# Patient Record
Sex: Female | Born: 1960 | Race: Black or African American | Hispanic: No | Marital: Married | Smoking: Never smoker
Health system: Southern US, Community
[De-identification: ages and names within clinical notes are randomized; demographics above are authoritative.]

## PROBLEM LIST (undated history)

## (undated) DIAGNOSIS — K219 Gastro-esophageal reflux disease without esophagitis: Secondary | ICD-10-CM

## (undated) DIAGNOSIS — M5412 Radiculopathy, cervical region: Secondary | ICD-10-CM

## (undated) DIAGNOSIS — D75A Glucose-6-phosphate dehydrogenase (G6PD) deficiency without anemia: Secondary | ICD-10-CM

## (undated) HISTORY — DX: Radiculopathy, cervical region: M54.12

## (undated) HISTORY — DX: Glucose-6-phosphate dehydrogenase (G6PD) deficiency without anemia: D75.A

## (undated) HISTORY — DX: Gastro-esophageal reflux disease without esophagitis: K21.9

---

## 1987-01-18 HISTORY — PX: BREAST BIOPSY: SHX20

## 1996-01-18 HISTORY — PX: REDUCTION MAMMAPLASTY: SUR839

## 2001-01-17 HISTORY — PX: TUBAL LIGATION: SHX77

## 2001-01-17 HISTORY — PX: CHOLECYSTECTOMY: SHX55

## 2003-01-18 HISTORY — PX: COSMETIC SURGERY: SHX468

## 2010-05-27 ENCOUNTER — Ambulatory Visit: Payer: Self-pay | Admitting: Obstetrics and Gynecology

## 2010-08-12 ENCOUNTER — Ambulatory Visit: Payer: Self-pay | Admitting: Family Medicine

## 2011-01-18 HISTORY — PX: COLONOSCOPY: SHX174

## 2011-09-20 ENCOUNTER — Ambulatory Visit: Payer: Self-pay | Admitting: Unknown Physician Specialty

## 2011-09-23 LAB — PATHOLOGY REPORT

## 2011-11-14 ENCOUNTER — Encounter: Payer: Self-pay | Admitting: Gastroenterology

## 2011-12-07 ENCOUNTER — Ambulatory Visit: Payer: Self-pay | Admitting: Gastroenterology

## 2015-02-24 ENCOUNTER — Encounter: Payer: Self-pay | Admitting: *Deleted

## 2015-03-04 ENCOUNTER — Ambulatory Visit (INDEPENDENT_AMBULATORY_CARE_PROVIDER_SITE_OTHER): Payer: 59 | Admitting: General Surgery

## 2015-03-04 ENCOUNTER — Encounter: Payer: Self-pay | Admitting: General Surgery

## 2015-03-04 VITALS — BP 104/66 | HR 72 | Resp 14 | Ht 64.0 in | Wt 203.0 lb

## 2015-03-04 DIAGNOSIS — R221 Localized swelling, mass and lump, neck: Secondary | ICD-10-CM

## 2015-03-04 NOTE — Progress Notes (Signed)
Patient ID: Kathryn Mcclain, female   DOB: 27-Nov-1960, 55 y.o.   MRN: [id number]  Chief Complaint  Patient presents with  . Mass    neck    HPI Kathryn Mcclain is a 55 y.o. female.  Here today for evaluation of right neck node. She states this area was found while getting a facial about 4 weeks ago. She does not feel anything in the area and is not having any pain. Denies trouble chewing or swallowing. She does admit to upper respiratory cold symptoms about 3 weeks ago. I have reviewed the history of present illness with the patient. HPI  Past Medical History  Diagnosis Date  . GERD (gastroesophageal reflux disease)   . G6PD deficiency [facility])     Past Surgical History  Procedure Laterality Date  . Cesarean section  1985  . Breast reduction surgery  1989  . Cholecystectomy  2003    Family History  Problem Relation Age of Onset  . Kidney failure Father   . Breast cancer Maternal Aunt   . Breast cancer Maternal Aunt   . Breast cancer Maternal Aunt   . Breast cancer Maternal Aunt     Social History Social History  Substance Use Topics  . Smoking status: Never Smoker   . Smokeless tobacco: Never Used  . Alcohol Use: No    No Known Allergies  Current Outpatient Prescriptions  Medication Sig Dispense Refill  . Calcium Carb-Cholecalciferol (CALCIUM 1000 + D PO) Take by mouth.    Marland Kitchen VITAMIN D, CHOLECALCIFEROL, PO Take 5,000 mg by mouth.     No current facility-administered medications for this visit.    Review of Systems Review of Systems  Constitutional: Negative.   Respiratory: Negative.   Cardiovascular: Negative.     Blood pressure 104/66, pulse 72, resp. rate 14, height  (1.626 m), weight 203 lb (92.08 kg), last menstrual period 01/17/2013.  Physical Exam Physical Exam  Constitutional: She is oriented to person, place, and time. She appears well-developed and well-nourished.  Neck: Neck supple.  Lymphadenopathy:    She has no cervical adenopathy.     She has no axillary adenopathy.  Neurological: She is alert and oriented to person, place, and time.  Skin: Skin is warm and dry.  Psychiatric: Her behavior is normal.  Unable to feel any mass or nodes in right side of neck  Data Reviewed none  Assessment    Stable physical exam, no node noted.     Plan    Follow up as needed or if any mass/node detected by pt or her pcp. The patient is aware to call back for any questions or concerns.        PCP:  None Ref GUTIERREZ, COLLEEN L  This information has been scribed by Dorathy Daft RNBC.   Maurie Olesen G 03/04/2015, 12:28 PM

## 2015-03-04 NOTE — Patient Instructions (Addendum)
Follow up as needed or if symptoms return The patient is aware to call back for any questions or concerns. 

## 2015-03-10 DIAGNOSIS — M7752 Other enthesopathy of left foot: Secondary | ICD-10-CM | POA: Diagnosis not present

## 2015-03-10 DIAGNOSIS — L851 Acquired keratosis [keratoderma] palmaris et plantaris: Secondary | ICD-10-CM | POA: Diagnosis not present

## 2015-04-20 DIAGNOSIS — L709 Acne, unspecified: Secondary | ICD-10-CM | POA: Diagnosis not present

## 2015-06-29 DIAGNOSIS — H524 Presbyopia: Secondary | ICD-10-CM | POA: Diagnosis not present

## 2015-06-29 DIAGNOSIS — H5203 Hypermetropia, bilateral: Secondary | ICD-10-CM | POA: Diagnosis not present

## 2015-06-29 DIAGNOSIS — H52223 Regular astigmatism, bilateral: Secondary | ICD-10-CM | POA: Diagnosis not present

## 2015-08-06 ENCOUNTER — Ambulatory Visit
Admission: RE | Admit: 2015-08-06 | Discharge: 2015-08-06 | Disposition: A | Discharge status: Home / Self Care | Payer: 59 | Source: Ambulatory Visit | Attending: Physician Assistant | Admitting: Physician Assistant

## 2015-08-06 ENCOUNTER — Encounter: Payer: Self-pay | Admitting: Physician Assistant

## 2015-08-06 ENCOUNTER — Ambulatory Visit: Payer: Self-pay | Admitting: Physician Assistant

## 2015-08-06 VITALS — BP 124/80 | HR 60 | Temp 98.3°F

## 2015-08-06 DIAGNOSIS — R05 Cough: Secondary | ICD-10-CM | POA: Insufficient documentation

## 2015-08-06 DIAGNOSIS — I517 Cardiomegaly: Secondary | ICD-10-CM | POA: Insufficient documentation

## 2015-08-06 DIAGNOSIS — R059 Cough, unspecified: Secondary | ICD-10-CM

## 2015-08-06 DIAGNOSIS — R918 Other nonspecific abnormal finding of lung field: Secondary | ICD-10-CM | POA: Diagnosis not present

## 2015-08-06 MED ORDER — BENZONATATE 200 MG PO CAPS: 200.0000 mg | ORAL_CAPSULE | Freq: Two times a day (BID) | ORAL | Status: DC | PRN

## 2015-08-06 NOTE — Progress Notes (Signed)
   Subjective:    Patient ID: Kathryn Mcclain, female    DOB: 1960/05/23, 55 y.o.   MRN: [id number]  HPI Patient states 4-5 months of non-productive cough. Patient states refractory to OTC medications. Patient is non-smoker. Cough is now interfering with her job. She is unable to have conversation without coughing. Denies seasonal allergies. No fever.   Review of Systems Negative except for compliant.    Objective:   Physical Exam HEENT unremarkable. Neck supple without adenopathy. Lungs CTA and Heart RRR.       Assessment & Plan:cough  Trial of Tessalon. Schedule chest x-ray.  Follow up tomorrow.

## 2015-08-24 DIAGNOSIS — J040 Acute laryngitis: Secondary | ICD-10-CM | POA: Diagnosis not present

## 2015-08-24 DIAGNOSIS — R05 Cough: Secondary | ICD-10-CM | POA: Diagnosis not present

## 2015-08-24 DIAGNOSIS — E049 Nontoxic goiter, unspecified: Secondary | ICD-10-CM | POA: Diagnosis not present

## 2015-08-25 ENCOUNTER — Other Ambulatory Visit: Payer: Self-pay | Admitting: Otolaryngology

## 2015-08-25 DIAGNOSIS — E041 Nontoxic single thyroid nodule: Secondary | ICD-10-CM

## 2015-08-25 DIAGNOSIS — E049 Nontoxic goiter, unspecified: Secondary | ICD-10-CM

## 2015-08-28 ENCOUNTER — Ambulatory Visit
Admission: RE | Admit: 2015-08-28 | Discharge: 2015-08-28 | Disposition: A | Discharge status: Home / Self Care | Payer: 59 | Source: Ambulatory Visit | Attending: Otolaryngology | Admitting: Otolaryngology

## 2015-08-28 ENCOUNTER — Other Ambulatory Visit: Payer: Self-pay

## 2015-08-28 DIAGNOSIS — E042 Nontoxic multinodular goiter: Secondary | ICD-10-CM | POA: Diagnosis not present

## 2015-08-28 DIAGNOSIS — E049 Nontoxic goiter, unspecified: Secondary | ICD-10-CM

## 2015-09-11 DIAGNOSIS — E049 Nontoxic goiter, unspecified: Secondary | ICD-10-CM | POA: Diagnosis not present

## 2016-03-07 DIAGNOSIS — H524 Presbyopia: Secondary | ICD-10-CM | POA: Diagnosis not present

## 2016-03-12 ENCOUNTER — Emergency Department (HOSPITAL_COMMUNITY): Payer: 59

## 2016-03-12 ENCOUNTER — Emergency Department (HOSPITAL_COMMUNITY)
Admission: EM | Admit: 2016-03-12 | Discharge: 2016-03-12 | Disposition: A | Discharge status: Home / Self Care | Payer: 59 | Attending: Emergency Medicine | Admitting: Emergency Medicine

## 2016-03-12 ENCOUNTER — Encounter (HOSPITAL_COMMUNITY): Payer: Self-pay | Admitting: *Deleted

## 2016-03-12 DIAGNOSIS — Y939 Activity, unspecified: Secondary | ICD-10-CM | POA: Insufficient documentation

## 2016-03-12 DIAGNOSIS — S161XXA Strain of muscle, fascia and tendon at neck level, initial encounter: Secondary | ICD-10-CM

## 2016-03-12 DIAGNOSIS — Y999 Unspecified external cause status: Secondary | ICD-10-CM | POA: Diagnosis not present

## 2016-03-12 DIAGNOSIS — R079 Chest pain, unspecified: Secondary | ICD-10-CM | POA: Diagnosis not present

## 2016-03-12 DIAGNOSIS — X58XXXA Exposure to other specified factors, initial encounter: Secondary | ICD-10-CM | POA: Insufficient documentation

## 2016-03-12 DIAGNOSIS — S4992XA Unspecified injury of left shoulder and upper arm, initial encounter: Secondary | ICD-10-CM | POA: Diagnosis present

## 2016-03-12 DIAGNOSIS — S46812A Strain of other muscles, fascia and tendons at shoulder and upper arm level, left arm, initial encounter: Secondary | ICD-10-CM | POA: Diagnosis not present

## 2016-03-12 DIAGNOSIS — Z79899 Other long term (current) drug therapy: Secondary | ICD-10-CM | POA: Diagnosis not present

## 2016-03-12 DIAGNOSIS — S46912A Strain of unspecified muscle, fascia and tendon at shoulder and upper arm level, left arm, initial encounter: Secondary | ICD-10-CM | POA: Diagnosis not present

## 2016-03-12 DIAGNOSIS — Y929 Unspecified place or not applicable: Secondary | ICD-10-CM | POA: Insufficient documentation

## 2016-03-12 LAB — BASIC METABOLIC PANEL
Anion gap: 8 (ref 5–15)
BUN: 9 mg/dL (ref 6–20)
CHLORIDE: 109 mmol/L (ref 101–111)
CO2: 25 mmol/L (ref 22–32)
CREATININE: 0.8 mg/dL (ref 0.44–1.00)
Calcium: 9 mg/dL (ref 8.9–10.3)
GFR calc Af Amer: >60 mL/min (ref >60)
GFR calc non Af Amer: >60 mL/min (ref >60)
GLUCOSE: 92 mg/dL (ref 65–99)
POTASSIUM: 4.3 mmol/L (ref 3.5–5.1)
SODIUM: 142 mmol/L (ref 135–145)

## 2016-03-12 LAB — CBC
HEMATOCRIT: 36.5 % (ref 36.0–46.0)
Hemoglobin: 11.6 g/dL — ABNORMAL LOW (ref 12.0–15.0)
MCH: 30.2 pg (ref 26.0–34.0)
MCHC: 31.8 g/dL (ref 30.0–36.0)
MCV: 95.1 fL (ref 78.0–100.0)
PLATELETS: 190 10*3/uL (ref 150–400)
RBC: 3.84 MIL/uL — ABNORMAL LOW (ref 3.87–5.11)
RDW: 13 % (ref 11.5–15.5)
WBC: 5.2 10*3/uL (ref 4.0–10.5)

## 2016-03-12 LAB — I-STAT TROPONIN, ED: Troponin i, poc: 0 ng/mL (ref 0.00–0.08)

## 2016-03-12 MED ORDER — IBUPROFEN 800 MG PO TABS
800.0000 mg | ORAL_TABLET | Freq: Once | ORAL | Status: AC
  Administered 2016-03-12: 800 mg via ORAL
  Filled 2016-03-12: qty 1

## 2016-03-12 MED ORDER — IBUPROFEN 800 MG PO TABS: 800.0000 mg | ORAL_TABLET | Freq: Four times a day (QID) | ORAL | 0 refills | Status: AC | PRN

## 2016-03-12 MED ORDER — ASPIRIN 81 MG PO CHEW
324.0000 mg | CHEWABLE_TABLET | Freq: Once | ORAL | Status: AC
  Administered 2016-03-12: 324 mg via ORAL
  Filled 2016-03-12: qty 4

## 2016-03-12 NOTE — ED Provider Notes (Signed)
[facility]-EMERGENCY DEPT Provider Note   CSN: [id number] Arrival date & time: 03/12/16  [id number]     History   Chief Complaint Chief Complaint  Patient presents with  . Shoulder Pain    left    HPI Kathryn Mcclain is a 56 y.o. female.  The history is provided by the patient.  Shoulder Pain   This is a new problem. The current episode started 1 to 2 hours ago. The problem occurs constantly. Progression since onset: waxingf and waning. The pain is present in the neck and left shoulder. Pertinent negatives include no numbness and full range of motion. She has tried nothing for the symptoms. There has been no history of extremity trauma.    Past Medical History:  Diagnosis Date  . G6PD deficiency (HCC)   . GERD (gastroesophageal reflux disease)     There are no active problems to display for this patient.   Past Surgical History:  Procedure Laterality Date  . BREAST REDUCTION SURGERY  1989  . CESAREAN SECTION  1985  . CHOLECYSTECTOMY  2003    OB History    Gravida Para Term Preterm AB Living   1 1           SAB TAB Ectopic Multiple Live Births                  Obstetric Comments   1st Menstrual Cycle:  15  1st Pregnancy:  22        Home Medications    Prior to Admission medications   Medication Sig Start Date End Date Taking? Authorizing Provider  acetaminophen (TYLENOL) 500 MG tablet Take 1,000 mg by mouth every 6 (six) hours as needed for moderate pain or headache.   Yes Historical Provider, MD  Polyethyl Glycol-Propyl Glycol (SYSTANE OP) Place 2 drops into both eyes as needed (for dry eyes).   Yes Historical Provider, MD  benzonatate (TESSALON) 200 MG capsule Take 1 capsule (200 mg total) by mouth 2 (two) times daily as needed for cough. Patient not taking: Reported on 03/12/2016 08/06/15   Joni Reiningonald K Smith, PA-C    Family History Family History  Problem Relation Age of Onset  . Kidney failure Father   . Breast cancer Maternal Aunt   . Breast cancer Maternal  Aunt   . Breast cancer Maternal Aunt   . Breast cancer Maternal Aunt     Social History Social History  Substance Use Topics  . Smoking status: Never Smoker  . Smokeless tobacco: Never Used  . Alcohol use No     Allergies   Patient has no known allergies.   Review of Systems Review of Systems  Constitutional: Positive for diaphoresis.  Respiratory: Negative for chest tightness and shortness of breath.   Cardiovascular: Negative for chest pain.  Gastrointestinal: Negative for nausea and vomiting.  Neurological: Negative for numbness.  All other systems reviewed and are negative.    Physical Exam Updated Vital Signs BP 115/94   Pulse (!) 54   Resp 23   Ht 5\' 4"  (1.626 m)   Wt 205 lb (93 kg)   SpO2 97%   BMI 35.19 kg/m   Physical Exam  Constitutional: She is oriented to person, place, and time. She appears well-developed and well-nourished. No distress.  HENT:  Head: Normocephalic.  Nose: Nose normal.  Eyes: Conjunctivae are normal.  Neck: Neck supple. No tracheal deviation present.  Cardiovascular: Normal rate and regular rhythm.   Pulmonary/Chest: Effort normal. No respiratory distress.  She exhibits no tenderness.  Abdominal: Soft. She exhibits no distension.  Musculoskeletal:       Left shoulder: She exhibits tenderness (at mid-trapezius muscle superiorly and medial to left scapula). She exhibits normal range of motion, no bony tenderness, no swelling, no deformity and no spasm.       Arms: Neurological: She is alert and oriented to person, place, and time.  Skin: Skin is warm and dry.  Psychiatric: She has a normal mood and affect.  Vitals reviewed.    ED Treatments / Results  Labs (all labs ordered are listed, but only abnormal results are displayed) Labs Reviewed  CBC - Abnormal; Notable for the following:       Result Value   RBC 3.84 (*)    Hemoglobin 11.6 (*)    All other components within normal limits  BASIC METABOLIC PANEL  I-STAT  TROPOININ, ED    EKG  EKG Interpretation  Date/Time:  Saturday March 12 2016 08:42:23 EST Ventricular Rate:  56 PR Interval:    QRS Duration: 77 QT Interval:  407 QTC Calculation: 393 R Axis:   -26 Text Interpretation:  Sinus rhythm Borderline left axis deviation Otherwise normal ECG No previous tracing Confirmed by Jouri Threat MD, Reuel Boom ([id number]) on 03/12/2016 9:14:47 AM       Radiology Dg Chest 2 View  Result Date: 03/12/2016 CLINICAL DATA:  Chest pain radiating to the left side of the neck and shoulder beginning this morning. EXAM: CHEST  2 VIEW COMPARISON:  PA and lateral chest 08/06/2015. FINDINGS: The lungs are clear. Heart size is normal. No pneumothorax or pleural fluid. No acute bony abnormality. Cholecystectomy clips noted. IMPRESSION: No acute disease. Electronically Signed   By: Drusilla Kanner M.D.   On: 03/12/2016 09:28    Procedures Procedures (including critical care time)  Medications Ordered in ED Medications  ibuprofen (ADVIL,MOTRIN) tablet 800 mg (not administered)  aspirin chewable tablet 324 mg (324 mg Oral Given 03/12/16 0905)     Initial Impression / Assessment and Plan / ED Course  I have reviewed the triage vital signs and the nursing notes.  Pertinent labs & imaging results that were available during my care of the patient were reviewed by me and considered in my medical decision making (see chart for details).     56 year old female presents with left shoulder pain that she first noted 2 hours prior to arrival to emergency department after waking. She states that the pain started in her left neck and has been aching and radiating into her left posterior shoulder and left arm. She has no ACS symptoms. Negative chest x-ray, troponin and normal EKG are reassuring with low overall risk given her atypical symptoms.  Point tenderness is noted over the left superior shoulder and posterior shoulder in the musculature suggesting MSK etiology. Offered local  anesthesia injection which patient declined. Patient was recommended to take short course of scheduled NSAIDs and engage in early mobility as definitive treatment. Plan to follow up with PCP as needed and return precautions discussed for worsening or new concerning symptoms.   Final Clinical Impressions(s) / ED Diagnoses   Final diagnoses:  Strain of cervical portion of left trapezius muscle    New Prescriptions New Prescriptions   No medications on file     Lyndal Pulley, MD 03/12/16 [phone]

## 2016-03-12 NOTE — ED Triage Notes (Signed)
Pt reports she awoke approx 0700 due to left upper chest and shoulder pain - states the pain radiates down from her neck to her left arm. Denies shortness of breath, n/v - admits to some diaphoresis. Pt denies any medical hx - only family hx includes htn and breast CA. Pt A&Ox4, in no acute distress at this time. Pleasant and cooperative.

## 2016-03-12 NOTE — ED Notes (Signed)
Pt ambulating independently w/ steady gait on d/c in no acute distress, A&Ox4. D/c instructions reviewed w/ pt and family - pt and family deny any further questions or concerns at present. Rx given x1  

## 2016-03-14 ENCOUNTER — Ambulatory Visit (INDEPENDENT_AMBULATORY_CARE_PROVIDER_SITE_OTHER): Payer: 59 | Admitting: Orthopaedic Surgery

## 2016-03-14 ENCOUNTER — Encounter (INDEPENDENT_AMBULATORY_CARE_PROVIDER_SITE_OTHER): Payer: Self-pay | Admitting: Orthopaedic Surgery

## 2016-03-14 DIAGNOSIS — M5412 Radiculopathy, cervical region: Secondary | ICD-10-CM

## 2016-03-14 MED ORDER — PREDNISONE 10 MG (21) PO TBPK: ORAL_TABLET | ORAL | 0 refills | Status: DC

## 2016-03-14 MED ORDER — DICLOFENAC SODIUM 75 MG PO TBEC: 75.0000 mg | DELAYED_RELEASE_TABLET | Freq: Two times a day (BID) | ORAL | 2 refills | Status: DC

## 2016-03-14 MED ORDER — METHOCARBAMOL 500 MG PO TABS
500.0000 mg | ORAL_TABLET | Freq: Four times a day (QID) | ORAL | 2 refills | Status: DC | PRN
Start: 2016-03-14 — End: 2016-08-05

## 2016-03-14 NOTE — Progress Notes (Signed)
Office Visit Note   Patient: Kathryn Mcclain           Date of Birth: 07-13-1960           MRN: [id number] Visit Date: 03/14/2016              Requested by: No referring provider defined for this encounter. PCP: No PCP Per Patient   Assessment & Plan: Visit Diagnoses:  1. Cervical radiculopathy     Plan: Patient likely has cervical radiculopathy. I recommend 2 weeks out of work. Also recommended physical therapy, diclofenac, Robaxin, Sterapred. I will see her back as needed. Questions encouraged and answered.  Follow-Up Instructions: Return if symptoms worsen or fail to improve.   Orders:  Orders Placed This Encounter  Procedures  . Ambulatory referral to Physical Therapy   Meds ordered this encounter  Medications  . diclofenac (VOLTAREN) 75 MG EC tablet    Sig: Take 1 tablet (75 mg total) by mouth 2 (two) times daily.    Dispense:  30 tablet    Refill:  2  . methocarbamol (ROBAXIN) 500 MG tablet    Sig: Take 1 tablet (500 mg total) by mouth every 6 (six) hours as needed for muscle spasms.    Dispense:  30 tablet    Refill:  2  . predniSONE (STERAPRED UNI-PAK 21 TAB) 10 MG (21) TBPK tablet    Sig: Take as directed    Dispense:  21 tablet    Refill:  0      Procedures: No procedures performed   Clinical Data: No additional findings.   Subjective: Chief Complaint  Patient presents with  . Left Shoulder - Pain  . Neck - Pain    Patient [age] year old female who woke up this past weekend with neck and shoulder and arm pain on the left side. She denies any injuries. She was evaluated in the ER cardiac workup was negative. Was in today for follow-up from the ER. Her pain is 5 out of 10 and radiates from the neck all the way down to the forearm. She denies any focal weakness or numbness.    Review of Systems Complete review of systems negative except for history of present illness  Objective: Vital Signs: There were no vitals taken for this  visit.  Physical Exam  Constitutional: She is oriented to person, place, and time. She appears well-developed and well-nourished.  HENT:  Head: Normocephalic and atraumatic.  Eyes: EOM are normal.  Neck: Neck supple.  Pulmonary/Chest: Effort normal.  Abdominal: Soft.  Neurological: She is alert and oriented to person, place, and time.  Skin: Skin is warm. Capillary refill takes less than 2 seconds.  Psychiatric: She has a normal mood and affect. Her behavior is normal. Judgment and thought content normal.  Nursing note and vitals reviewed.   Ortho Exam Exam the left upper extremity is normal for motor sensory and reflexes. She has a positive shoulder abduction sign. Specialty Comments:  No specialty comments available.  Imaging: No results found.   PMFS History: Patient Active Problem List   Diagnosis Date Noted  . Cervical radiculopathy 03/14/2016   Past Medical History:  Diagnosis Date  . G6PD deficiency (HCC)   . GERD (gastroesophageal reflux disease)     Family History  Problem Relation Age of Onset  . Kidney failure Father   . Breast cancer Maternal Aunt   . Breast cancer Maternal Aunt   . Breast cancer Maternal Aunt   . Breast cancer  Maternal Aunt     Past Surgical History:  Procedure Laterality Date  . BREAST REDUCTION SURGERY  1989  . CESAREAN SECTION  1985  . CHOLECYSTECTOMY  2003   Social History   Occupational History  . Not on file.   Social History Main Topics  . Smoking status: Never Smoker  . Smokeless tobacco: Never Used  . Alcohol use No  . Drug use: No  . Sexual activity: Not on file

## 2016-03-15 ENCOUNTER — Encounter (INDEPENDENT_AMBULATORY_CARE_PROVIDER_SITE_OTHER): Payer: Self-pay | Admitting: Orthopaedic Surgery

## 2016-03-15 NOTE — Telephone Encounter (Signed)
Patient calling wanting to know if you had received her message to you thru MY CHART re Percocet.  [place] and is able to pick up Rx when called, but is hoping to hear from us before the office closes at 5pm.

## 2016-03-15 NOTE — Telephone Encounter (Signed)
Patient sent an e-mail. Please advise. See message below. Thanks.     GM Dr. Roda ShuttersXu,    Medication request: Will you write me a prescription for Percocet? The pain is not getting much better with the NSAID. I had a c-section many years ago and percocet and motrin really help. My son is 55 years old - I remember the pain and the medication I took to alleviate the pain was percocet and motrin.     Thank You.

## 2016-03-15 NOTE — Telephone Encounter (Signed)
Yes #10 of percocet.

## 2016-03-16 ENCOUNTER — Other Ambulatory Visit (INDEPENDENT_AMBULATORY_CARE_PROVIDER_SITE_OTHER): Payer: Self-pay

## 2016-03-16 MED ORDER — OXYCODONE-ACETAMINOPHEN 5-325 MG PO TABS: 1.0000 | ORAL_TABLET | Freq: Every day | ORAL | 0 refills | Status: DC

## 2016-03-17 ENCOUNTER — Ambulatory Visit: Payer: 59 | Attending: Orthopaedic Surgery | Admitting: Physical Therapy

## 2016-03-17 DIAGNOSIS — M542 Cervicalgia: Secondary | ICD-10-CM | POA: Diagnosis not present

## 2016-03-17 DIAGNOSIS — M6281 Muscle weakness (generalized): Secondary | ICD-10-CM | POA: Diagnosis not present

## 2016-03-17 DIAGNOSIS — M5412 Radiculopathy, cervical region: Secondary | ICD-10-CM | POA: Insufficient documentation

## 2016-03-17 DIAGNOSIS — M62830 Muscle spasm of back: Secondary | ICD-10-CM | POA: Insufficient documentation

## 2016-03-17 DIAGNOSIS — R293 Abnormal posture: Secondary | ICD-10-CM | POA: Diagnosis not present

## 2016-03-17 NOTE — Patient Instructions (Signed)
    Trigger Point Dry Needling  . What is Trigger Point Dry Needling (DN)? o DN is a physical therapy technique used to treat muscle pain and dysfunction. Specifically, DN helps deactivate muscle trigger points (muscle knots).  o A thin filiform needle is used to penetrate the skin and stimulate the underlying trigger point. The goal is for a local twitch response (LTR) to occur and for the trigger point to relax. No medication of any kind is injected during the procedure.   . What Does Trigger Point Dry Needling Feel Like?  o The procedure feels different for each individual patient. Some patients report that they do not actually feel the needle enter the skin and overall the process is not painful. Very mild bleeding may occur. However, many patients feel a deep cramping in the muscle in which the needle was inserted. This is the local twitch response.   Marland Kitchen. How Will I feel after the treatment? o Soreness is normal, and the onset of soreness may not occur for a few hours. Typically this soreness does not last longer than two days.  o Bruising is uncommon, however; ice can be used to decrease any possible bruising.  o In rare cases feeling tired or nauseous after the treatment is normal. In addition, your symptoms may get worse before they get better, this period will typically not last longer than 24 hours.   . What Can I do After My Treatment? o Increase your hydration by drinking more water for the next 24 hours. o You may place ice or heat on the areas treated that have become sore, however, do not use heat on inflamed or bruised areas. Heat often brings more relief post needling. o You can continue your regular activities, but vigorous activity is not recommended initially after the treatment for 24 hours. o DN is best combined with other physical therapy such as strengthening, stretching, and other therapies.   Garen LahLawrie Beardsley, PT Certified Exercise Expert for the Aging Adult  03/17/16  10:38 AM Phone: [phone] Fax: [phone]

## 2016-03-17 NOTE — Therapy (Signed)
[facility] [address] [place], [place], [address] Phone: [phone]   Fax:  [phone]  Physical Therapy Evaluation  Patient Details  Name: Kathryn Mcclain MRN: [id number] Date of Birth: 06/27/1960 Referring Provider: Tarry KosXU, Naiping M MD  Encounter Date: 03/17/2016      PT End of Session - 03/17/16 1018    Visit Number 1   Number of Visits 12   Date for PT Re-Evaluation 04/28/16   Authorization Type UMR / TRIC[employer]   Authorization Time Period 04-28-16   PT Start Time 1015   PT Stop Time 1059   PT Time Calculation (min) 44 min   Activity Tolerance Patient tolerated treatment well   Behavior During Therapy [facility] for tasks assessed/performed      Past Medical History:  Diagnosis Date  . G6PD deficiency (HCC)   . GERD (gastroesophageal reflux disease)     Past Surgical History:  Procedure Laterality Date  . BREAST REDUCTION SURGERY  1989  . CESAREAN SECTION  1985  . CHOLECYSTECTOMY  2003    There were no vitals filed for this visit.       Subjective Assessment - 03/17/16 1019    Subjective Pt states she woke up on Saturday morning in pain.  in my left neck and to my elbow, Most of pain is in pain between left shoulder and elbow, I have spasms in my neck and back. I work Programmer, systemslabor and deleviery at [employer]   Limitations Lifting;House hold activities;Sitting   How long can you sit comfortably? 15 min   Diagnostic tests x ray   Patient Stated Goals To reduce pain, better movement in left shoulder   Currently in Pain? Yes   Pain Score 4    Pain Location Neck  upper back on left, rib junction   Pain Orientation Left   Pain Descriptors / Indicators Aching;Spasm   Pain Type Acute pain   Pain Onset In the past 7 days   Pain Frequency Constant   Aggravating Factors  sitting long periods of time, do my hair and tie my shoes   Pain Relieving Factors heat and ice , medication, motrin, Robaxin            [facility]  PT Assessment - 03/17/16 1024      Assessment   Medical Diagnosis cervical radiculopathy on left   Referring Provider Tarry KosXU, Naiping M MD   Onset Date/Surgical Date 03/12/16   Hand Dominance Right   Next MD Visit call when needed   Prior Therapy none     Precautions   Precautions None     Restrictions   Weight Bearing Restrictions No     Balance Screen   Has the patient fallen in the past 6 months No   Has the patient had a decrease in activity level because of a fear of falling?  No   Is the patient reluctant to leave their home because of a fear of falling?  No     Home Environment   Living Environment Private residence   Living Arrangements Spouse/significant other   Home Access Stairs to enter   Entrance Stairs-Number of Steps 1   Entrance Stairs-Rails Left   Home Layout Two level     Prior Function   Level of Independence Independent   Vocation Full time employment   Vocation Requirements MAU RN     Cognition   Overall Cognitive Status Within Functional Limits for tasks assessed     Observation/Other  Assessments   Focus on Therapeutic Outcomes (FOTO)  FOTOIntake 57%  limitation 45%  predicted 23%      Posture/Postural Control   Posture/Postural Control Postural limitations   Postural Limitations Rounded Shoulders;Forward head   Posture Comments Pt with significant gaurding and elevated left Upper trap/levator     ROM / Strength   AROM / PROM / Strength AROM;Strength     AROM   Cervical Flexion 53  no pain   Cervical Extension 55  ERP   Cervical - Right Side Bend 30   Cervical - Left Side Bend 25  ERP   Cervical - Right Rotation 75   Cervical - Left Rotation 60     Strength   Overall Strength Deficits   Right Shoulder Flexion 5/5   Right Shoulder ABduction 5/5   Left Shoulder Flexion 4/5  gaurding due to pain   Left Shoulder ABduction 4/5  gaurding due to pain     Palpation   Palpation comment spasm of left Upper trapezius and paraspinals of upper  pack/cervical L>R     Spurling's   Findings Positive   Side Left   Comment Pt with relief when left on top of head                   [facility] Adult PT Treatment/Exercise - 03/17/16 1024      Manual Therapy   Manual Therapy Taping   Manual therapy comments trigger point soft tissue of left upper trap, levator and rhomboid   McConnell upper trap inhibition taper     Neck Exercises: Stretches   Upper Trapezius Stretch 30 seconds;3 reps   Upper Trapezius Stretch Limitations stretch left only   Levator Stretch 30 seconds;3 reps   Levator Stretch Limitations stretch left only   Other Neck Stretches neck retraction 5 x 5 sec hold          Trigger Point Dry Needling - 03/17/16 1042    Consent Given? Yes   Education Handout Provided Yes   Muscles Treated Upper Body Rhomboids;Levator scapulae;Upper trapezius  left side only   Upper Trapezius Response Twitch reponse elicited;Palpable increased muscle length   Levator Scapulae Response Twitch response elicited;Palpable increased muscle length   Rhomboids Response Twitch response elicited;Palpable increased muscle length              PT Education - 03/17/16 1038    Education provided Yes   Education Details POC,, explanation of findings, Trigger point dry needling and neck stretches HEP   Person(s) Educated Patient   Methods Explanation;Demonstration;Verbal cues;Handout   Comprehension Verbalized understanding;Returned demonstration          PT Short Term Goals - 03/17/16 1108      PT SHORT TERM GOAL #1   Title STG=LTG           PT Long Term Goals - 03/17/16 1114      PT LONG TERM GOAL #1   Title "Demonstrate and verbalize techniques to reduce the risk of re-injury including: lifting, posture, body mechanics.    Baseline Target 04-29-14   Time 6   Period Weeks   Status New     PT LONG TERM GOAL #2   Title "FOTO will improve from 43% limtation    to 23% limtation     indicating improved functional  mobility.    Baseline Target 04-28-16   Time 6   Period Weeks   Status New     PT LONG TERM GOAL #3  Title "Pt will tolerate sitting 1 hour without increased pain to ride in car without increased pain.   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  She woke up on 03-12-16 with suddent spasm and radiating pain down left arm.  She presents now with 4/10 pain and radiating into lateral upper arm. Ms. Bergh c/o of increased neck pain and finds it difficult to fix her hair or bend down to tie shoes or do her job as MSN labor and delivery.  Pt presents with signs and symptoms compatible with cervical radiculopathy due to disc derangement. and subsequent spasms.   Pt would benefit from skilled PT for 2 times a week for 6 weeks to address above impariments and functional limitations, severe spasms of upper trapezius and left rhomboid and return to pain-free PLOF. 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 review neck stretches   PT Home Exercise Plan Neck retraction, levator and upper trap stretch   Consulted and Agree with Plan of Care Patient      Patient will benefit from skilled therapeutic intervention in order to improve the following deficits and impairments:  Pain, Impaired UE functional use, Increased muscle spasms, Decreased strength, Postural dysfunction, Improper body mechanics, Decreased range of motion  Visit Diagnosis: Cervicalgia  Radiculopathy, cervical region  Muscle spasm of back  Abnormal posture  Muscle weakness (generalized)      G-Codes - 04-13-2016 1102    Functional Assessment Tool Used (Outpatient Only) FOTO   Functional Limitation Changing and maintaining body position   Changing and Maintaining Body Position Current Status (U0454) At least 40 percent but less than 60 percent impaired, limited or restricted  43%   Changing and Maintaining Body Position Goal Status (U9811) At least 20 percent but less than 40 percent impaired, limited or restricted  23%       Problem List Patient Active Problem List   Diagnosis Date Noted  . Cervical radiculopathy 03/14/2016   Garen Lah, PT Certified Exercise Expert for the Aging Adult  2016/04/13 11:25 AM Phone: [phone] Fax: [phone]  [facility] [address] [place], [place], [address] Phone: [phone]   Fax:  [phone]  Name: Kathryn Mcclain MRN: [id number] Date of Birth: 08-24-1960

## 2016-03-21 ENCOUNTER — Encounter (INDEPENDENT_AMBULATORY_CARE_PROVIDER_SITE_OTHER): Payer: Self-pay | Admitting: Orthopaedic Surgery

## 2016-03-21 ENCOUNTER — Encounter (INDEPENDENT_AMBULATORY_CARE_PROVIDER_SITE_OTHER): Payer: Self-pay

## 2016-03-22 ENCOUNTER — Ambulatory Visit: Payer: 59 | Admitting: Physical Therapy

## 2016-03-22 DIAGNOSIS — M62830 Muscle spasm of back: Secondary | ICD-10-CM

## 2016-03-22 DIAGNOSIS — M5412 Radiculopathy, cervical region: Secondary | ICD-10-CM | POA: Diagnosis not present

## 2016-03-22 DIAGNOSIS — M6281 Muscle weakness (generalized): Secondary | ICD-10-CM | POA: Diagnosis not present

## 2016-03-22 DIAGNOSIS — M542 Cervicalgia: Secondary | ICD-10-CM

## 2016-03-22 DIAGNOSIS — R293 Abnormal posture: Secondary | ICD-10-CM | POA: Diagnosis not present

## 2016-03-22 NOTE — Therapy (Signed)
[facility] [address] [place], [place], [address] Phone: [phone]   Fax:  [phone]  Physical Therapy Treatment  Patient Details  Name: GOWRI SUCHAN MRN: [id number] Date of Birth: 1960-01-30 Referring Provider: Tarry Kos MD  Encounter Date: 03/22/2016      PT End of Session - 03/22/16 0813    Visit Number 2   Number of Visits 12   Date for PT Re-Evaluation 04/28/16   Authorization Type UMR / TRICARE   Authorization Time Period 04-28-16   PT Start Time 0800   PT Stop Time 0855   PT Time Calculation (min) 55 min   Activity Tolerance Patient tolerated treatment well   Behavior During Therapy [facility] for tasks assessed/performed      Past Medical History:  Diagnosis Date  . G6PD deficiency (HCC)   . GERD (gastroesophageal reflux disease)     Past Surgical History:  Procedure Laterality Date  . BREAST REDUCTION SURGERY  1989  . CESAREAN SECTION  1985  . CHOLECYSTECTOMY  2003    There were no vitals filed for this visit.      Subjective Assessment - 03/22/16 0805    Subjective The dry needling and the tape helped.  I am returning to work this coming Sunday.  I was not too sore with the dry needling.    Limitations Lifting;House hold activities;Sitting   How long can you sit comfortably? 30 minutes   Currently in Pain? Yes   Pain Score 4   no medication.     Pain Location Neck   Pain Orientation Left   Pain Descriptors / Indicators Aching;Spasm   Pain Type Acute pain   Pain Onset In the past 7 days   Pain Frequency Constant                         [facility] Adult PT Treatment/Exercise - 03/22/16 0818      Shoulder Exercises: Standing   External Rotation Strengthening;Both;15 reps   Theraband Level (Shoulder External Rotation) Level 2 (Red)   External Rotation Limitations x 2 with VC and TC   Extension Strengthening;Both;15 reps;Theraband   Theraband Level (Shoulder Extension) Level  2 (Red)   Extension Limitations x2 TC and VC    Row Strengthening;Both;15 reps   Theraband Level (Shoulder Row) Level 2 (Red)   Row Limitations x2 VC and TC     Moist Heat Therapy   Number Minutes Moist Heat 15 Minutes   Moist Heat Location Cervical;Shoulder     Manual Therapy   Manual Therapy Soft tissue mobilization;Myofascial release   Manual therapy comments tigger point on left upper trap and levator   Joint Mobilization left lateral UPA C-2 to C-6  PA mobs as well grade 3/4   Soft tissue mobilization bil upper trap and levator with IASTYM   Myofascial Release left upper trap     Neck Exercises: Stretches   Upper Trapezius Stretch 3 reps;30 seconds   Upper Trapezius Stretch Limitations left only   Levator Stretch 3 reps;30 seconds   Levator Stretch Limitations left only          Trigger Point Dry Needling - 03/22/16 0847    Consent Given? Yes   Education Handout Provided No  previously given   Muscles Treated Upper Body Rhomboids;Levator scapulae;Upper trapezius  left C-3 to C-6 erector spinae   Upper Trapezius Response Twitch reponse elicited;Palpable increased muscle length  bil   Levator Scapulae  Response Twitch response elicited;Palpable increased muscle length  bil   Rhomboids Response Twitch response elicited;Palpable increased muscle length  left only              PT Education - 03/22/16 0810    Education provided Yes   Education Details Added to HEP standing scapular stabilizers, reviewed neck stretching   Person(s) Educated Patient   Methods Explanation;Demonstration;Verbal cues;Handout   Comprehension Verbalized understanding;Returned demonstration          PT Short Term Goals - 03/22/16 0815      PT SHORT TERM GOAL #1   Title STG=LTG           PT Long Term Goals - 03/22/16 0815      PT LONG TERM GOAL #1   Title "Demonstrate and verbalize techniques to reduce the risk of re-injury including: lifting, posture, body mechanics.     Baseline Target 04-29-14   Time 6   Period Weeks   Status On-going     PT LONG TERM GOAL #2   Title "FOTO will improve from 43% limtation    to 23% limtation     indicating improved functional mobility.    Baseline Target 04-28-16   Time 6   Period Weeks   Status On-going     PT LONG TERM GOAL #3   Title "Pt will tolerate sitting 1 hour without increased pain to ride in car without increased pain.   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  She states she is not using medication today and she really likes the TDN for neck.  She is returning to work on Sunday night and is encouraged about progress so far.  Pt edcuated on HEP for strengthening   Rehab Potential Excellent   PT Frequency 2x / week   PT Duration 6 weeks   PT Treatment/Interventions ADLs/Self Care Home Management;Cryotherapy;Electrical Stimulation;Iontophoresis 4mg /ml Dexamethasone;Moist Heat;Ultrasound;Traction;Therapeutic exercise;Neuromuscular re-education;Patient/family education;Passive range of motion;Manual techniques;Dry needling;Taping   PT Next Visit Plan assess DN  posture training and deep neck flexors. review neck stretches/HEP    PT Home Exercise Plan Neck retraction, levator and upper trap stretch, standing scapular ex      Patient will benefit from skilled therapeutic  intervention in order to improve the following deficits and impairments:  Pain, Impaired UE functional use, Increased muscle spasms, Decreased strength, Postural dysfunction, Improper body mechanics, Decreased range of motion  Visit Diagnosis: Cervicalgia  Radiculopathy, cervical region  Muscle spasm of back  Abnormal posture  Muscle weakness (generalized)     Problem List Patient Active Problem List   Diagnosis Date Noted  . Cervical radiculopathy 03/14/2016   Garen Lah, PT  Certified Exercise Expert for the Aging Adult  03/22/16 8:49 AM Phone: [phone] Fax: [phone]  [facility] [address] [place], [place], [address] Phone: [phone]   Fax:  [phone]  Name: Jeanne IvanMargaret A Bagshaw MRN: [id number] Date of Birth: 1960/06/19

## 2016-03-22 NOTE — Patient Instructions (Signed)
   Garen LahLawrie Ward Boissonneault, PT Certified Exercise Expert for the Aging Adult  03/22/16 8:10 AM Phone: [phone] Fax: [phone]

## 2016-03-23 ENCOUNTER — Encounter (INDEPENDENT_AMBULATORY_CARE_PROVIDER_SITE_OTHER): Payer: Self-pay | Admitting: Orthopaedic Surgery

## 2016-03-23 ENCOUNTER — Encounter (INDEPENDENT_AMBULATORY_CARE_PROVIDER_SITE_OTHER): Payer: Self-pay

## 2016-03-24 ENCOUNTER — Ambulatory Visit: Payer: 59 | Admitting: Physical Therapy

## 2016-03-24 DIAGNOSIS — R293 Abnormal posture: Secondary | ICD-10-CM | POA: Diagnosis not present

## 2016-03-24 DIAGNOSIS — M542 Cervicalgia: Secondary | ICD-10-CM

## 2016-03-24 DIAGNOSIS — M6281 Muscle weakness (generalized): Secondary | ICD-10-CM | POA: Diagnosis not present

## 2016-03-24 DIAGNOSIS — M5412 Radiculopathy, cervical region: Secondary | ICD-10-CM | POA: Diagnosis not present

## 2016-03-24 DIAGNOSIS — M62830 Muscle spasm of back: Secondary | ICD-10-CM

## 2016-03-24 NOTE — Therapy (Signed)
[facility] [address] [place], [place], [address] Phone: [phone]   Fax:  [phone]  Physical Therapy Treatment  Patient Details  Name: Kathryn Mcclain MRN: [id number] Date of Birth: Jan 28, 1960 Referring Provider: Tarry KosXU, Naiping M MD  Encounter Date: 03/24/2016      Kathryn End of Session - 03/24/16 1419    Visit Number 3   Number of Visits 12   Date for Kathryn Re-Evaluation 04/28/16   Authorization Type UMR / TRICARE   Authorization Time Period 04-28-16   Kathryn Start Time 0215   Kathryn Stop Time 0310   Kathryn Time Calculation (min) 55 min   Activity Tolerance Patient tolerated treatment well   Behavior During Therapy [facility] for tasks assessed/performed      Past Medical History:  Diagnosis Date  . G6PD deficiency (HCC)   . GERD (gastroesophageal reflux disease)     Past Surgical History:  Procedure Laterality Date  . BREAST REDUCTION SURGERY  1989  . CESAREAN SECTION  1985  . CHOLECYSTECTOMY  2003    There were no vitals filed for this visit.      Subjective Assessment - 03/24/16 1419    Subjective I dont think I need the needling today.  I went to the salon for my hair and I felt gaurded.     Limitations Lifting;House hold activities;Sitting   How long can you sit comfortably? 1 hour   Patient Stated Goals To reduce pain, better movement in left shoulder   Currently in Pain? Yes   Pain Score 3    Pain Location Neck   Pain Orientation Left   Pain Descriptors / Indicators Aching;Spasm   Pain Type Acute pain   Pain Onset 1 to 4 weeks ago   Pain Frequency Constant            [facility] Kathryn Assessment - 03/24/16 1451      AROM   Cervical Flexion 55  no pain   Cervical Extension 60   Cervical - Right Side Bend 50   Cervical - Left Side Bend 43   Cervical - Right Rotation 80   Cervical - Left Rotation 70                     [facility] Mcclain Kathryn Treatment/Exercise - 03/24/16 1423      Neck Exercises: Supine   Neck Retraction 5 reps;5 secs   Other Supine Exercise supine scapular stabilizers x 10 with UE flex, abd diagonals both ways and ER bil 10 each     Moist Heat Therapy   Number Minutes Moist Heat 15 Minutes   Moist Heat Location Cervical;Shoulder     Ultrasound   Ultrasound Location left upper trap   Ultrasound Parameters 1.5 w/cm2  100%   Ultrasound Goals Pain     Manual Therapy   Manual Therapy Soft tissue mobilization;Myofascial release   Manual therapy comments trigger point on left upper trap and levator   Joint Mobilization left lateral UPA C-2 to C-6  PA mobs as well grade 3/4   Soft tissue mobilization bil upper trap and levator with IASTYM   Myofascial Release left upper trap                Kathryn Education - 03/24/16 1428    Education provided Yes   Education Details supine scapular stabiizers with green t band and self snag with towel   Person(s) Educated Patient   Methods Explanation;Demonstration;Tactile cues;Verbal cues;Handout  Comprehension Verbalized understanding;Returned demonstration          Kathryn Short Term Goals - 03/22/16 0815      Kathryn SHORT TERM GOAL #1   Title STG=LTG           Kathryn Long Term Goals - 03/24/16 1458      Kathryn LONG TERM GOAL #1   Title "Demonstrate and verbalize techniques to reduce the risk of re-injury including: lifting, posture, body mechanics.    Baseline Target 04-29-14   Time 6   Period Weeks   Status On-going     Kathryn LONG TERM GOAL #2   Title "FOTO will improve from 43% limtation    to 23% limtation     indicating improved functional mobility.    Baseline Target 04-28-16   Time 6   Period Weeks   Status On-going     Kathryn LONG TERM GOAL #3   Title "Kathryn will tolerate sitting 1 hour without increased pain to ride in car without increased pain.   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  AROM increased in every plane  See flowsheet.  Kathryn declined TDN,  Kathryn given supine scapular stabilizers with green t band to increase exercises and Kathryn pain decreased to 2/10 at end of session.  will continue to progress. Kathryn returns to work on Sunday.   Rehab Potential Excellent   Kathryn Frequency 2x / week   Kathryn Duration 6 weeks   Kathryn Treatment/Interventions ADLs/Self Care Home Management;Cryotherapy;Electrical Stimulation;Iontophoresis 4mg /ml Dexamethasone;Moist Heat;Ultrasound;Traction;Therapeutic exercise;Neuromuscular re-education;Patient/family education;Passive range of motion;Manual techniques;Dry needling;Taping   Kathryn Next Visit Plan  posture training give ADL handout and deep neck flexors. review neck stretches/HEP    Kathryn Home Exercise Plan Neck retraction, levator and upper trap stretch, standing scapular ex supine scap stabilizers   Consulted and Agree with Plan of Care Patient      Patient will benefit from skilled therapeutic intervention in order to improve the following deficits and impairments:  Pain, Impaired UE functional use, Increased muscle spasms, Decreased strength, Postural dysfunction, Improper body mechanics, Decreased range of motion  Visit Diagnosis: Cervicalgia  Radiculopathy, cervical region  Muscle spasm of back  Abnormal posture  Muscle weakness (generalized)     Problem List Patient Active Problem List   Diagnosis Date Noted  . Cervical radiculopathy 03/14/2016   Kathryn Mcclain, Kathryn Mcclain  03/24/16 3:02 PM Phone: [phone] Fax: [phone]  [facility] [address] [place], [place], [address] Phone: [phone]   Fax:  [phone]  Name: Kathryn Mcclain MRN: [id number] Date of Birth: 13-Sep-1960

## 2016-03-24 NOTE — Patient Instructions (Signed)
Over Head Pull: Narrow Grip       On back, knees bent, feet flat, band across thighs, elbows straight but relaxed. Pull hands apart (start). Keeping elbows straight, bring arms up and over head, hands toward floor. Keep pull steady on band. Hold momentarily. Return slowly, keeping pull steady, back to start. Repeat _15__ times. Band color __green____   Side Pull: Double Arm   On back, knees bent, feet flat. Arms perpendicular to body, shoulder level, elbows straight but relaxed. Pull arms out to sides, elbows straight. Resistance band comes across collarbones, hands toward floor. Hold momentarily. Slowly return to starting position. Repeat _15__ times. Band color green_____   Sash   On back, knees bent, feet flat, left hand on left hip, right hand above left. Pull right arm DIAGONALLY (hip to shoulder) across chest. Bring right arm along head toward floor. Hold momentarily. Slowly return to starting position. Repeat 15___ times. Do with left arm. Band color __green____   Shoulder Rotation: Double Arm   On back, knees bent, feet flat, elbows tucked at sides, bent 90, hands palms up. Pull hands apart and down toward floor, keeping elbows near sides. Hold momentarily. Slowly return to starting position. Repeat _15__ times. Band color _green_____   Self- SNAG with towel as shown in clinic.  Handout given with instructions for Mulligan technique and pt return demo  Garen LahLawrie Katoya Amato, PT Certified Exercise Expert for the Aging Adult  03/24/16 2:27 PM Phone: [phone] Fax: [phone]

## 2016-04-05 ENCOUNTER — Ambulatory Visit (INDEPENDENT_AMBULATORY_CARE_PROVIDER_SITE_OTHER): Payer: Self-pay

## 2016-04-05 ENCOUNTER — Ambulatory Visit (INDEPENDENT_AMBULATORY_CARE_PROVIDER_SITE_OTHER): Payer: 59 | Admitting: Orthopaedic Surgery

## 2016-04-05 ENCOUNTER — Ambulatory Visit: Payer: 59 | Admitting: Physical Therapy

## 2016-04-05 ENCOUNTER — Encounter: Payer: Self-pay | Admitting: Physical Therapy

## 2016-04-05 DIAGNOSIS — M62830 Muscle spasm of back: Secondary | ICD-10-CM | POA: Diagnosis not present

## 2016-04-05 DIAGNOSIS — R293 Abnormal posture: Secondary | ICD-10-CM | POA: Diagnosis not present

## 2016-04-05 DIAGNOSIS — M542 Cervicalgia: Secondary | ICD-10-CM | POA: Diagnosis not present

## 2016-04-05 DIAGNOSIS — M5412 Radiculopathy, cervical region: Secondary | ICD-10-CM | POA: Diagnosis not present

## 2016-04-05 DIAGNOSIS — M25512 Pain in left shoulder: Secondary | ICD-10-CM | POA: Diagnosis not present

## 2016-04-05 DIAGNOSIS — M6281 Muscle weakness (generalized): Secondary | ICD-10-CM

## 2016-04-05 MED ORDER — GABAPENTIN 100 MG PO CAPS: 100.0000 mg | ORAL_CAPSULE | Freq: Three times a day (TID) | ORAL | 2 refills | Status: DC

## 2016-04-05 NOTE — Progress Notes (Signed)
Office Visit Note   Patient: Kathryn Mcclain           Date of Birth: [birth year]           MRN: [id number] Visit Date: 04/05/2016              Requested by: No referring provider defined for this encounter. PCP: No PCP Per Patient   Assessment & Plan: Visit Diagnoses:  1. Left shoulder pain, unspecified chronicity   2. Neck pain     Plan: MRI cervical spine ordered to evaluate for structural abnormalities. Follow-up after MRI  Follow-Up Instructions: Return in about 2 weeks (around 04/19/2016).   Orders:  Orders Placed This Encounter  Procedures  . XR Shoulder Left  . XR Cervical Spine 2 or 3 views  . MR Cervical Spine w/o contrast   Meds ordered this encounter  Medications  . gabapentin (NEURONTIN) 100 MG capsule    Sig: Take 1 capsule (100 mg total) by mouth 3 (three) times daily.    Dispense:  60 capsule    Refill:  2      Procedures: No procedures performed   Clinical Data: No additional findings.   Subjective: No chief complaint on file.   Patient comes back today for continued left upper extremity radiculopathy. She had an episode where she was having radiation into her chest so she went to the ER was ruled out for cardiac event. She has constant 8 out of 10 pain for the last 3 days. Denies any numbness. He endorses subjective weakness.    Review of Systems  Constitutional: Negative.   HENT: Negative.   Eyes: Negative.   Respiratory: Negative.   Cardiovascular: Negative.   Endocrine: Negative.   Musculoskeletal: Negative.   Neurological: Negative.   Hematological: Negative.   Psychiatric/Behavioral: Negative.   All other systems reviewed and are negative.    Objective: Vital Signs: There were no vitals taken for this visit.  Physical Exam  Constitutional: She is oriented to person, place, and time. She appears well-developed and well-nourished.  Pulmonary/Chest: Effort normal.  Neurological: She is alert and oriented to person, place,  and time.  Skin: Skin is warm. Capillary refill takes less than 2 seconds.  Psychiatric: She has a normal mood and affect. Her behavior is normal. Judgment and thought content normal.  Nursing note and vitals reviewed.   Ortho Exam Left shoulder exam and cervical spine exam showed positive Spurling sign. There is no sensorimotor deficits. Specialty Comments:  No specialty comments available.  Imaging: Xr Cervical Spine 2 Or 3 Views  Result Date: 04/05/2016 Cervical kyphosis.  Xr Shoulder Left  Result Date: 04/05/2016 No acute or structural abnormalities    PMFS History: Patient Active Problem List   Diagnosis Date Noted  . Cervical radiculopathy 03/14/2016   Past Medical History:  Diagnosis Date  . G6PD deficiency (HCC)   . GERD (gastroesophageal reflux disease)     Family History  Problem Relation Age of Onset  . Kidney failure Father   . Breast cancer Maternal Aunt   . Breast cancer Maternal Aunt   . Breast cancer Maternal Aunt   . Breast cancer Maternal Aunt     Past Surgical History:  Procedure Laterality Date  . BREAST REDUCTION SURGERY  1989  . CESAREAN SECTION  1985  . CHOLECYSTECTOMY  2003   Social History   Occupational History  . Not on file.   Social History Main Topics  . Smoking status: Never Smoker  .  Smokeless tobacco: Never Used  . Alcohol use No  . Drug use: No  . Sexual activity: Not on file

## 2016-04-05 NOTE — Therapy (Signed)
[facility] [address] [place], [place], [address] Phone: [phone]   Fax:  [phone]  Physical Therapy Treatment  Patient Details  Name: Kathryn Mcclain MRN: [id number] Date of Birth: 1960-06-09 Referring Provider: Tarry Kos MD  Encounter Date: 04/05/2016      PT End of Session - 04/05/16 1103    Visit Number 4   Number of Visits 12   Date for PT Re-Evaluation 04/28/16   Authorization Type UMR / [employer]IC[employer]   Authorization Time Period 04-28-16   PT Start Time 1101   PT Stop Time 1155   PT Time Calculation (min) 54 min   Activity Tolerance Patient tolerated treatment well   Behavior During Therapy [facility] for tasks assessed/performed      Past Medical History:  Diagnosis Date  . G6PD deficiency (HCC)   . GERD (gastroesophageal reflux disease)     Past Surgical History:  Procedure Laterality Date  . BREAST REDUCTION SURGERY  1989  . CESAREAN SECTION  1985  . CHOLECYSTECTOMY  2003    There were no vitals filed for this visit.      Subjective Assessment - 04/05/16 1105    Subjective I had a rough week this past week, I have called Dr. Roda Shutters because my pain is 8/10 and I cant get any relief. I have been medicating all weekend.  I  cant sleep more than 3 hours at a time and I have pain that goes down to my wrist and  left lower scapula.     Limitations Lifting;House hold activities;Sitting   How long can you sit comfortably? 1 hour   Currently in Pain? Yes   Pain Score 8    Pain Location Neck   Pain Orientation Left   Pain Descriptors / Indicators Aching;Stabbing;Spasm;Radiating   Pain Radiating Towards radiates doen left shoulder to elbow and to left wrist   Pain Onset More than a month ago   Pain Frequency Constant   Aggravating Factors  cant sit or walk or stand or do hair or tie shoes without pain            [facility] PT Assessment - 04/05/16 1120      AROM   Cervical Flexion 45   Cervical Extension 52   pain radiates down left arm   Cervical - Right Side Bend 25   Cervical - Left Side Bend 40   Cervical - Right Rotation 60   Cervical - Left Rotation 55     Strength   Overall Strength Deficits   Right Shoulder Flexion 5/5   Right Shoulder ABduction 5/5   Left Shoulder Flexion 4-/5  gaurding due to pain   Left Shoulder ABduction 4-/5  gaurding due to pain     Distraction Test   Findngs Positive   side Left   Comment Pt with decrease symptoms, tried cervical traction for RX                     [facility] Adult PT Treatment/Exercise - 04/05/16 1128      Moist Heat Therapy   Number Minutes Moist Heat 15 Minutes   Moist Heat Location Cervical;Shoulder     Electrical Stimulation   Electrical Stimulation Location left cervical and shoulder   Electrical Stimulation Action IFC   Electrical Stimulation Parameters to pt tolerance 15 minu  19 ma   Electrical Stimulation Goals Pain     Traction   Type of Traction Cervical  Min (lbs) 18   Max (lbs) 25   Hold Time 60 sec   Rest Time 10 sec   Time 11   patient asked to discontinue     Manual Therapy   Manual Therapy Soft tissue mobilization   Manual therapy comments trigger point left upper trap. levator, cervical and teres major/subscapularis   McConnell --                  PT Short Term Goals - 03/22/16 0815      PT SHORT TERM GOAL #1   Title STG=LTG           PT Long Term Goals - 03/24/16 1458      PT LONG TERM GOAL #1   Title "Demonstrate and verbalize techniques to reduce the risk of re-injury including: lifting, posture, body mechanics.    Baseline Target 04-29-14   Time 6   Period Weeks   Status On-going     PT LONG TERM GOAL #2   Title "FOTO will improve from 43% limtation    to 23% limtation     indicating improved functional mobility.    Baseline Target 04-28-16   Time 6   Period Weeks   Status On-going     PT LONG TERM GOAL #3   Title "Pt will tolerate sitting 1 hour without  increased pain to ride in car without increased pain.   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 Fick returns with 8/10 pain in left cervical with radiating pain into left wrist. Pt with positive distraction test and tried cervical traction.  Pt asked to stop traction at 11 minutes with 25 lb max and 18 minimum.  Pt with spasming left upper trap and left shoulder radiation of pain. with increasing pain now radiating into left rist and C6/7 distribution.  Pt also has worsening weakness, spasms today as well with cervical radiculopathy .  Pt will return to Dr. Roda Shutters for reevaluation today at 4:15.  Pt seemed to decrease spasm with e stim and may benefit from TENS unit for home use.  Will continue as Dr. Roda Shutters deems appropriate after his consult.     Rehab Potential Excellent   PT Frequency 2x / week   PT Duration 6 weeks   PT Treatment/Interventions ADLs/Self Care Home Management;Cryotherapy;Electrical Stimulation;Iontophoresis 4mg /ml Dexamethasone;Moist Heat;Ultrasound;Traction;Therapeutic exercise;Neuromuscular re-education;Patient/family education;Passive range of motion;Manual techniques;Dry needling;Taping   PT Next Visit Plan TENS eval. assess traction benefit and see about visit with Dr. Roda Shutters   PT Home Exercise Plan Neck retraction, levator and upper trap stretch, standing scapular ex supine scap stabilizers   Consulted and Agree with Plan  of Care Patient      Patient will benefit from skilled therapeutic intervention  in order to improve the following deficits and impairments:  Pain, Impaired UE functional use, Increased muscle spasms, Decreased strength, Postural dysfunction, Improper body mechanics, Decreased range of motion  Visit Diagnosis: Cervicalgia  Radiculopathy, cervical region  Muscle spasm of back  Abnormal posture  Muscle weakness (generalized)     Problem List Patient Active Problem List   Diagnosis Date Noted  . Cervical radiculopathy 03/14/2016    Garen LahLawrie Kass Herberger, PT Certified Exercise Expert for the Aging Adult  04/05/16 12:32 PM Phone: [phone] Fax: [phone]  [facility] [address] [place], [place], [address] Phone: [phone]   Fax:  [phone]  Name: Kathryn Mcclain MRN: [id number] Date of Birth: 1960/07/08

## 2016-04-07 ENCOUNTER — Ambulatory Visit: Payer: 59 | Admitting: Physical Therapy

## 2016-04-07 ENCOUNTER — Encounter: Payer: Self-pay | Admitting: Physical Therapy

## 2016-04-07 DIAGNOSIS — M62830 Muscle spasm of back: Secondary | ICD-10-CM | POA: Diagnosis not present

## 2016-04-07 DIAGNOSIS — M6281 Muscle weakness (generalized): Secondary | ICD-10-CM | POA: Diagnosis not present

## 2016-04-07 DIAGNOSIS — R293 Abnormal posture: Secondary | ICD-10-CM | POA: Diagnosis not present

## 2016-04-07 DIAGNOSIS — M542 Cervicalgia: Secondary | ICD-10-CM

## 2016-04-07 DIAGNOSIS — M5412 Radiculopathy, cervical region: Secondary | ICD-10-CM

## 2016-04-07 NOTE — Patient Instructions (Addendum)
TENS stands for Transcutaneous Electrical Nerve Stimulation. In other words, electrical impulses are allowed to pass through the skin in order to excite a nerve.   Purpose and Use of TENS:  TENS is a method used to manage acute and chronic pain without the use of drugs. It has been effective in managing pain associated with surgery, sprains, strains, trauma, rheumatoid arthritis, and neuralgias. It is a non-addictive, low risk, and non-invasive technique used to control pain. It is not, by any means, a curative form of treatment.   How TENS Works:  Most TENS units are a [employer] pocket-sized unit powered by one 9 volt battery. Attached to the outside of the unit are two lead wires where two pins and/or snaps connect on each wire. All units come with a set of four reusable pads or electrodes. These are placed on the skin surrounding the area involved. By inserting the leads into  the pads, the electricity can pass from the unit making the circuit complete.  As the intensity is turned up slowly, the electrical current enters the body from the electrodes through the skin to the surrounding nerve fibers. This triggers the release of hormones from within the body. These hormones contain pain relievers. By increasing the circulation of these hormones, the person's pain may be lessened. It is also believed that the electrical stimulation itself helps to block the pain messages being sent to the brain, thus also decreasing the body's perception of pain.   Hazards:  TENS units are NOT to be used by patients with PACEMAKERS, DEFIBRILLATORS, DIABETIC PUMPS, PREGNANT WOMEN, and patients with SEIZURE DISORDERS.  TENS units are NOT to be used over the heart, throat, brain, or spinal cord.  One of the major side effects from the TENS unit may be skin irritation. Some people may develop a rash if they are sensitive to the materials used in the electrodes or the connecting wires.   Wear the unit for 30 minutes to 1 hour  and not more than 2 hours.  See how long you can tolerate between sessions.   Avoid overuse due the body getting used to the stem making it not as effective over time.    .Flexibility: Corner Stretch    Standing in corner with hands just above shoulder level and feet __6-8__ inches from corner, lean forward until a comfortable stretch is felt across chest. Hold 30____ seconds. Repeat ___3_ times per set. Do _1___ sets per session. Do _2___ sessions per day.  http://orth.exer.us/343   Copyright  VHI. All rights reserved.      Garen LahLawrie Beardsley, PT Certified Exercise Expert for the Aging Adult  04/07/16 8:09 AM Phone: [phone] Fax: [phone]

## 2016-04-07 NOTE — Therapy (Addendum)
[place], [place], [address] Phone: [phone]   Fax:  [phone]  Physical Therapy Treatment/Discharge Note  Patient Details  Name: Kathryn Mcclain MRN: [id number] Date of Birth: Mar 06, 1960 Referring Provider: Leandrew Koyanagi MD  Encounter Date: 04/07/2016      PT End of Session - 04/07/16 0758    Visit Number 5   Number of Visits 12   Date for PT Re-Evaluation 04/28/16   Authorization Type UMR / TRICARE   PT Start Time 0800   PT Stop Time 0855   PT Time Calculation (min) 55 min   Activity Tolerance Patient tolerated treatment well   Behavior During Therapy [facility] for tasks assessed/performed      Past Medical History:  Diagnosis Date  . G6PD deficiency ([place])   . GERD (gastroesophageal reflux disease)     Past Surgical History:  Procedure Laterality Date  . BREAST REDUCTION SURGERY  1989  . CESAREAN SECTION  1985  . CHOLECYSTECTOMY  2003    There were no vitals filed for this visit.      Subjective Assessment - 04/07/16 0803    Subjective I went to Dr. Erlinda Hong and he gave me Gabapentin and I have an MRI scheduled for March 31st.  I am doing much better today.    Limitations Lifting;House hold activities;Sitting   How long can you sit comfortably? 30 min to 1 hour aggravated when I sit for long periods   Diagnostic tests x ray scheduled for MRI     Patient Stated Goals To reduce pain, better movement in left shoulder   Currently in Pain? Yes   Pain Score 3    Pain Location Neck   Pain Orientation Left   Pain Descriptors / Indicators Aching   Pain Type Acute pain   Pain Onset More than a month ago   Pain Frequency Constant            [facility] PT Assessment - 04/07/16 0845      AROM   Cervical Flexion 45   Cervical Extension 45   Cervical - Right Side Bend 20   Cervical - Left Side Bend 32   Cervical - Right Rotation 55   Cervical - Left Rotation 52                      [facility] Adult PT Treatment/Exercise - 04/07/16 0807      Self-Care   Self-Care Other Self-Care Comments   Other Self-Care Comments  TENS education and return demo     Neck Exercises: Supine   Neck Retraction 10 reps;5 secs   Neck Retraction Limitations with towel roll under neck with no pillow   Other Supine Exercise supine scapular stabilizers x 15 with UE flex, abd diagonals both ways and ER bil 10 each     Moist Heat Therapy   Number Minutes Moist Heat 15 Minutes   Moist Heat Location Cervical                PT Education - 04/07/16 0824    Education provided Yes   Education Details Education on TENS and return demo.  reviewed exercises added corner stretch for pecs to HEP   Person(s) Educated Patient   Methods Explanation;Demonstration;Tactile cues;Verbal cues;Handout   Comprehension Verbalized understanding;Returned demonstration          PT Short Term Goals - 03/22/16 0815      PT SHORT TERM GOAL #1  Title STG=LTG           PT Long Term Goals - 04/07/16 6301      PT LONG TERM GOAL #1   Title "Demonstrate and verbalize techniques to reduce the risk of re-injury including: lifting, posture, body mechanics.    Baseline Target 04-29-14 Pt more aware of posture at work and sitting   Time 6   Period Weeks   Status On-going     PT LONG TERM GOAL #2   Title "FOTO will improve from 43% limtation    to 23% limtation     indicating improved functional mobility.    Baseline Target 04-28-16   Time 6   Period Weeks   Status On-going     PT LONG TERM GOAL #3   Title "Pt will tolerate sitting 1 hour without increased pain to ride in car without increased pain.   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  Pt was educated and given TENS for home use and pain management of neck symptoms  3/10 .  Pt was able to return demo of TENS until and reviewed HEP.  Pt improved from 8/10 pain to 3/10 today.  Pt states she has most pain sitting for long periods of time. pt with decreased AROM from eval. of cervical   Rehab Potential Excellent   PT Frequency 2x / week   PT Duration 6 weeks   PT Treatment/Interventions ADLs/Self Care Home Management;Cryotherapy;Electrical Stimulation;Iontophoresis 30m/ml Dexamethasone;Moist Heat;Ultrasound;Traction;Therapeutic exercise;Neuromuscular re-education;Patient/family education;Passive range of motion;Manual techniques;Dry needling;Taping   PT Next Visit Plan assess TENS benefit,  progress exercises,    PT Home Exercise Plan Neck retraction, levator and upper trap stretch, standing scapular ex supine scap stabilizers, pec stretch in corner   Consulted and Agree with Plan of Care Patient      Patient will benefit from skilled therapeutic intervention in order to improve the following deficits and impairments:  Pain, Impaired UE functional use, Increased muscle spasms, Decreased strength, Postural dysfunction, Improper body mechanics, Decreased range of motion  Visit  Diagnosis: Cervicalgia  Radiculopathy, cervical region  Muscle spasm of back  Abnormal posture  Muscle weakness (generalized)     Problem  List Patient Active Problem List   Diagnosis Date Noted  . Cervical radiculopathy 03/14/2016    Voncille Lo, PT Certified Exercise Expert for the Aging Adult  04/07/16 8:47 AM Phone: [phone] Fax: [facility] [address] [place], [place], [address] Phone: [phone]   Fax:  [phone]  Name: Kathryn Mcclain MRN: [id number] Date of Birth: 04/28/1960   g code 04-07-16  Maintaing or Changing Body Position Goal CJ Discharge CK    PHYSICAL THERAPY DISCHARGE SUMMARY  Visits from Start of Care: 5  Current functional level related to goals / functional outcomes: See above,  Unknown as the pt did not return   Remaining deficits:Unknown   Education / Equipment: HEP TENS Plan: Patient agrees to discharge.  Patient goals were not met. Patient is being discharged due to not returning since the last visit.  ?????    Voncille Lo, PT Certified Exercise Expert for the Aging Adult  05/19/16 2:39 PM Phone: [phone] Fax: [phone]

## 2016-04-16 ENCOUNTER — Ambulatory Visit
Admission: RE | Admit: 2016-04-16 | Discharge: 2016-04-16 | Disposition: A | Discharge status: Home / Self Care | Payer: 59 | Source: Ambulatory Visit | Attending: Orthopaedic Surgery | Admitting: Orthopaedic Surgery

## 2016-04-16 DIAGNOSIS — M542 Cervicalgia: Secondary | ICD-10-CM

## 2016-04-16 DIAGNOSIS — M50222 Other cervical disc displacement at C5-C6 level: Secondary | ICD-10-CM | POA: Diagnosis not present

## 2016-04-16 DIAGNOSIS — M25512 Pain in left shoulder: Secondary | ICD-10-CM

## 2016-04-26 ENCOUNTER — Encounter (INDEPENDENT_AMBULATORY_CARE_PROVIDER_SITE_OTHER): Payer: Self-pay | Admitting: Orthopaedic Surgery

## 2016-04-26 ENCOUNTER — Ambulatory Visit (INDEPENDENT_AMBULATORY_CARE_PROVIDER_SITE_OTHER): Payer: 59 | Admitting: Orthopaedic Surgery

## 2016-04-26 DIAGNOSIS — M5412 Radiculopathy, cervical region: Secondary | ICD-10-CM

## 2016-04-26 NOTE — Progress Notes (Signed)
   Office Visit Note   Patient: Kathryn Mcclain           Date of Birth: [birth year]           MRN: [id number] Visit Date: 04/26/2016              Requested by: No referring provider defined for this encounter. PCP: No PCP Per Patient   Assessment & Plan: Visit Diagnoses:  1. 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 Patient clinically is doing well. She feels that she is getting better. She would like to continue to watch this. She has not needed to take gabapentin and a couple days. I do want her to avoid any heavy lifting or repetitive use of her neck or arm. She will give [place] a call back if she wants to try an epidural steroid injection.  Follow-Up Instructions: Return if symptoms worsen or fail to improve.   Orders:  No orders of the defined types were placed in this encounter.  No orders of the defined types were placed in this encounter.     Procedures: No procedures performed   Clinical Data: No additional findings.   Subjective: Chief Complaint  Patient presents with  . Left Shoulder - Follow-up    Kathryn Mcclain comes back today to review her cervical spine MRI. Overall she is feeling better. Her function is improving her weakness is getting better. She denies any numbness    Review of Systems  Constitutional: Negative.   HENT: Negative.   Eyes: Negative.   Respiratory: Negative.   Cardiovascular: Negative.   Endocrine: Negative.   Musculoskeletal: Negative.   Neurological: Negative.   Hematological: Negative.   Psychiatric/Behavioral: Negative.   All other systems reviewed and are negative.    Objective: Vital Signs: There were no vitals taken for this visit.  Physical Exam  Constitutional: She is oriented to person, place, and time. She appears well-developed and well-nourished.  Pulmonary/Chest: Effort normal.  Neurological: She is alert and oriented to person, place, and  time.  Skin: Skin is warm. Capillary refill takes less than 2 seconds.  Psychiatric: She has a normal mood and affect. Her behavior is normal. Judgment and thought content normal.  Nursing note and vitals reviewed.   Ortho Exam Exam of her left upper extremity shows no focal motor or sensory deficits. Normal reflexes. Specialty Comments:  No specialty comments available.  Imaging: No results found.   PMFS History: Patient Active Problem List   Diagnosis Date Noted  . Cervical radiculopathy 03/14/2016   Past Medical History:  Diagnosis Date  . G6PD deficiency (HCC)   . GERD (gastroesophageal reflux disease)     Family History  Problem Relation Age of Onset  . Kidney failure Father   . Breast cancer Maternal Aunt   . Breast cancer Maternal Aunt   . Breast cancer Maternal Aunt   . Breast cancer Maternal Aunt     Past Surgical History:  Procedure Laterality Date  . BREAST REDUCTION SURGERY  1989  . CESAREAN SECTION  1985  . CHOLECYSTECTOMY  2003   Social History   Occupational History  . Not on file.   Social History Main Topics  . Smoking status: Never Smoker  . Smokeless tobacco: Never Used  . Alcohol use No  . Drug use: No  . Sexual activity: Not on file

## 2016-07-05 ENCOUNTER — Other Ambulatory Visit: Payer: Self-pay | Admitting: Certified Nurse Midwife

## 2016-07-05 DIAGNOSIS — Z1231 Encounter for screening mammogram for malignant neoplasm of breast: Secondary | ICD-10-CM

## 2016-07-07 ENCOUNTER — Ambulatory Visit
Admission: RE | Admit: 2016-07-07 | Discharge: 2016-07-07 | Disposition: A | Discharge status: Home / Self Care | Payer: 59 | Source: Ambulatory Visit | Attending: Certified Nurse Midwife | Admitting: Certified Nurse Midwife

## 2016-07-07 DIAGNOSIS — Z1231 Encounter for screening mammogram for malignant neoplasm of breast: Secondary | ICD-10-CM | POA: Insufficient documentation

## 2016-07-12 ENCOUNTER — Inpatient Hospital Stay
Admission: RE | Admit: 2016-07-12 | Discharge: 2016-07-12 | Disposition: A | Discharge status: Home / Self Care | Payer: Self-pay | Source: Ambulatory Visit | Attending: *Deleted | Admitting: *Deleted

## 2016-07-12 ENCOUNTER — Other Ambulatory Visit: Payer: Self-pay | Admitting: *Deleted

## 2016-07-12 DIAGNOSIS — Z9289 Personal history of other medical treatment: Secondary | ICD-10-CM

## 2016-08-04 ENCOUNTER — Ambulatory Visit: Payer: 59 | Admitting: Certified Nurse Midwife

## 2016-08-05 ENCOUNTER — Ambulatory Visit (INDEPENDENT_AMBULATORY_CARE_PROVIDER_SITE_OTHER): Payer: 59 | Admitting: Certified Nurse Midwife

## 2016-08-05 ENCOUNTER — Encounter: Payer: Self-pay | Admitting: Certified Nurse Midwife

## 2016-08-05 ENCOUNTER — Other Ambulatory Visit: Payer: Self-pay | Admitting: Certified Nurse Midwife

## 2016-08-05 VITALS — BP 106/72 | HR 56 | Ht 64.0 in | Wt 202.0 lb

## 2016-08-05 DIAGNOSIS — Z131 Encounter for screening for diabetes mellitus: Secondary | ICD-10-CM

## 2016-08-05 DIAGNOSIS — Z01419 Encounter for gynecological examination (general) (routine) without abnormal findings: Secondary | ICD-10-CM

## 2016-08-05 DIAGNOSIS — Z124 Encounter for screening for malignant neoplasm of cervix: Secondary | ICD-10-CM | POA: Diagnosis not present

## 2016-08-05 DIAGNOSIS — Z13220 Encounter for screening for lipoid disorders: Secondary | ICD-10-CM

## 2016-08-05 NOTE — Patient Instructions (Signed)
Preventing Osteoporosis, Adult Osteoporosis is a condition that causes the bones to get weaker. With osteoporosis, the bones become thinner, and the normal spaces in bone tissue become larger. This can make the bones weak and cause them to break more easily. People who have osteoporosis are more likely to break their wrist, spine, or hip. Even a minor accident or injury can be enough to break weak bones. Osteoporosis can occur with aging. Your body constantly replaces old bone tissue with new tissue. As you get older, you may lose bone tissue more quickly, or it may be replaced more slowly. Osteoporosis is more likely to develop if you have poor nutrition or do not get enough calcium or vitamin D. Other lifestyle factors can also play a role. By making some diet and lifestyle changes, you can help to keep your bones healthy and help to prevent osteoporosis. What nutrition changes can be made? Nutrition plays an important role in maintaining healthy, strong bones.  Make sure you get enough calcium every day from food or from calcium supplements. ? If you are age 50 or younger, aim to get 1,000 mg of calcium every day. ? If you are older than age 50, aim to get 1,200 mg of calcium every day.  Try to get enough vitamin D every day. ? If you are age 70 or younger, aim to get 600 international units (IU) every day. ? If you are older than age 70, aim to get 800 international units every day.  Follow a healthy diet. Eat plenty of foods that contain calcium and vitamin D. ? Calcium is in milk, cheese, yogurt, and other dairy products. Some fish and vegetables are also good sources of calcium. Many foods such as cereals and breads have had calcium added to them (are fortified). Check nutrition labels to see how much calcium is in a food or drink. ? Foods that contain vitamin D include milk, cereals, salmon, and tuna. Your body also makes vitamin D when you are out in the sun. Bare skin exposure to the sun on  your face, arms, legs, or back for no more than 30 minutes a day, 2 times per week is more than enough. Beyond that, it is important to use sunblock to protect your skin from sunburn, which increases your risk for skin cancer.  What lifestyle changes can be made? Making changes in your everyday life can also play an important role in preventing osteoporosis.  Stay active and get exercise every day. Ask your health care provider what types of exercise are best for you.  Do not use any products that contain nicotine or tobacco, such as cigarettes and e-cigarettes. If you need help quitting, ask your health care provider.  Limit alcohol intake to no more than 1 drink a day for nonpregnant women and 2 drinks a day for men. One drink equals 12 oz of beer, 5 oz of wine, or 1 oz of hard liquor.  Why are these changes important? Making these nutrition and lifestyle changes can:  Help you develop and maintain healthy, strong bones.  Prevent loss of bone mass and the problems that are caused by that loss, such as broken bones and delayed healing.  Make you feel better mentally and physically.  What can happen if changes are not made? Problems that can result from osteoporosis can be very serious. These may include:  A higher risk of broken bones that are painful and do not heal well.  Physical malformations, such as   a collapsed spine or a hunched back.  Problems with movement.  Where to find support: If you need help making changes to prevent osteoporosis, talk with your health care provider. You can ask for a referral to a diet and nutrition specialist (dietitian) and a physical therapist. Where to find more information: Learn more about osteoporosis from:  NIH Osteoporosis and Related Bone Diseases National Resource Center: www.niams.nih.gov/health_info/bone/osteoporosis/osteoporosis_ff.asp  U.S. Office on Women's Health:  www.womenshealth.gov/publications/our-publications/fact-sheet/osteoporosis.html  National Osteoporosis Foundation: www.nof.org/patients/what-is-osteoporosis/  Summary  Osteoporosis is a condition that causes weak bones that are more likely to break.  Eating a healthy diet and making sure you get enough calcium and vitamin D can help prevent osteoporosis.  Other ways to reduce your risk of osteoporosis include getting regular exercise and avoiding alcohol and products that contain nicotine or tobacco. This information is not intended to replace advice given to you by your health care provider. Make sure you discuss any questions you have with your health care provider. Document Released: 01/18/2015 Document Revised: 09/14/2015 Document Reviewed: 09/14/2015 Elsevier Interactive Patient Education  2018 Elsevier Inc.  

## 2016-08-05 NOTE — Progress Notes (Signed)
Gynecology Annual Exam  PCP: Patient, No Pcp Per  Chief Complaint:  Chief Complaint  Patient presents with  . Gynecologic Exam    History of Present Illness:This is 56 year old African American/Black married female  G 1 P 1 0 0 1 , whose last normal menstrual period was about 4 years ago. . She presents today for her annual exam.  Her last pap was  07/25/2014 and was NIL/neg. Her menses are absent secondary to menopause. She is not taking HRT. She is sexually active She reports no problems with intercourse. She is having no significant gyn symptoms.Denies vaginal bleeding, severe hot flashes, or vaginal dryness.  She performs breast self-exams occasionally. She reports no breast symptoms. Her last mammogram was 07/07/2016 and was negative. There is a positive maternal family history of breast cancer (maternal great aunts x 4). No genetic testing has been done.  There is no family history of colon cancer. She has had a colonoscopy 5 years ago (09/20/2011)  and a 4mm polyp was removed that was not precancerous.  The patient has had a lipid panel test. The lipid panel test was performed 2015 and the results were borderline. Hmg A1C was also normal in 2015, but her vitamin D3 was low at 19. She has not been taking  her vitamin D3.  The patient does not exercise regularly. Probably gets adequate calcium in her diet. Since her last annual GYN exam 2 years ago, she has had some problems with left shoulder pain due to cervical radiculopathy at C5-6. Her symptoms resolved with PT.  The patient does not smoke, does not use recreational drugs, and is a minimal drinker.     The patient denies current symptoms of depression.    Review of Systems: Review of Systems  Constitutional: Negative for chills, fever and weight loss.  HENT: Negative for congestion, sinus pain and sore throat.   Eyes: Negative for blurred vision and pain.  Respiratory: Negative for hemoptysis, shortness of breath and wheezing.     Cardiovascular: Negative for chest pain, palpitations and leg swelling.  Gastrointestinal: Negative for abdominal pain, blood in stool, diarrhea, heartburn, nausea and vomiting.  Genitourinary: Negative for dysuria, frequency, hematuria and urgency.  Musculoskeletal: Negative for back pain, joint pain and myalgias.  Skin: Negative for itching and rash.  Neurological: Negative for dizziness, tingling and headaches.  Endo/Heme/Allergies: Negative for environmental allergies and polydipsia. Does not bruise/bleed easily.       Negative for hirsutism   Psychiatric/Behavioral: Negative for depression. The patient is not nervous/anxious and does not have insomnia.     Past Medical History:  Past Medical History:  Diagnosis Date  . Cervical radiculopathy at C5    HNP at C5-6  . G6PD deficiency (HCC)   . GERD (gastroesophageal reflux disease)     Past Surgical History:  Past Surgical History:  Procedure Laterality Date  . BREAST BIOPSY Right 1989  . CESAREAN SECTION  1985  . CHOLECYSTECTOMY  2003  . COLONOSCOPY  2013   benign polyp  . COSMETIC SURGERY  2005   mini tummy tuck  . REDUCTION MAMMAPLASTY  1998  . TUBAL LIGATION  2003    Family History:  Family History  Problem Relation Age of Onset  . Kidney failure Father   . Hypertension Father   . Hypertension Mother   . Breast cancer Maternal Aunt        maternal great aunt  . Breast cancer Maternal Aunt  maternal great aunt  . Breast cancer Maternal Aunt        maternal great aunt  . Breast cancer Maternal Aunt        maternal great aunt  . Hypertension Brother   . Hypertension Brother   . Hypertension Brother   . Diabetes Brother   . Breast cancer Maternal Aunt        maternal great aunt    Social History:  Social History   Social History  . Marital status: Married    Spouse name: N/A  . Number of children: 1  . Years of education: N/A   Occupational History  . Registered Nurse     [employer]    Social History Main Topics  . Smoking status: Never Smoker  . Smokeless tobacco: Never Used  . Alcohol use No  . Drug use: No  . Sexual activity: Yes    Partners: Male    Birth control/ protection: Post-menopausal, None   Other Topics Concern  . Not on file   Social History Narrative  . No narrative on file    Allergies:  No Known Allergies  Medications:  Current Outpatient Prescriptions:  .  Omega 3-6-9 Fatty Acids (OMEGA 3-6-9 COMPLEX PO), Take 1 tablet by mouth daily., Disp: , Rfl:  .  acetaminophen (TYLENOL) 500 MG tablet, Take 1,000 mg by mouth every 6 (six) hours as needed for moderate pain or headache., Disp: , Rfl:  Physical Exam Vitals: BP 106/72   Pulse (!) 56   Ht 5\' 4"  (1.626 m)   Wt 202 lb (91.6 kg)   LMP 01/17/2013 (Within Months)   BMI 34.67 kg/m   General: pleasant BF in NAD HEENT: normocephalic, anicteric Neck: no thyroid enlargement, no palpable nodules, no cervical lymphadenopathy  Pulmonary: No increased work of breathing, CTAB Cardiovascular: RRR, without murmur  Breast: Breast symmetrical, scars present from reduction mammoplasty, no tenderness, no palpable nodules or masses, no skin or nipple retraction present, no nipple discharge.  No axillary, infraclavicular or supraclavicular lymphadenopathy. Abdomen: Soft, non-tender, non-distended.  Umbilicus without lesions.  No hepatomegaly or masses palpable. No evidence of hernia. Genitourinary:  External: Normal external female genitalia.  Normal urethral meatus, normal Bartholin's and Skene's glands.    Vagina: Normal vaginal mucosa, no evidence of prolapse    Cervix: Grossly normal in appearance, no bleeding, non-tender  Uterus: Anteverted, normal size, shape, and consistency, mobile, and non-tender  Adnexa: No adnexal masses, non-tender  Rectal: deferred  Lymphatic: no evidence of inguinal lymphadenopathy Extremities: no edema, erythema, or tenderness Neurologic: Grossly intact Psychiatric:  mood appropriate, affect full     Assessment: 56 y.o. G1P1 annual gyn exam  Plan:   1) Breast cancer screening - recommend monthly self breast exam and annual screening mammogram Mammogram is up to date.  2) Cervical cancer screening - Pap was done.   3) Colon cancer screening- colonoscopy 5 years ago, to check with [facility] to see when next is due.  4) Routine healthcare maintenance including cholesterol and diabetes screening ordered today .   5) RTO 1 year for annual  6) Discussed prevention of osteoporosis with adequate calcium and vitamin D intake and exercise. Given handout on same and website address for [facility].  Farrel Conners, CNM

## 2016-08-06 ENCOUNTER — Encounter: Payer: Self-pay | Admitting: Certified Nurse Midwife

## 2016-08-06 LAB — LIPID PANEL WITH LDL/HDL RATIO
Cholesterol, Total: 208 mg/dL — ABNORMAL HIGH (ref 100–199)
HDL: 58 mg/dL (ref >39)
LDL Calculated: 136 mg/dL — ABNORMAL HIGH (ref 0–99)
LDL/HDL RATIO: 2.3 ratio (ref 0.0–3.2)
TRIGLYCERIDES: 72 mg/dL (ref 0–149)
VLDL CHOLESTEROL CAL: 14 mg/dL (ref 5–40)

## 2016-08-06 LAB — HEMOGLOBIN A1C
Est. average glucose Bld gHb Est-mCnc: 105 mg/dL
Hgb A1c MFr Bld: 5.3 % (ref 4.8–5.6)

## 2016-08-09 ENCOUNTER — Encounter: Payer: Self-pay | Admitting: Certified Nurse Midwife

## 2016-08-09 LAB — IGP,RFX APTIMA HPV ALL PTH: PAP SMEAR COMMENT: 0

## 2016-08-10 ENCOUNTER — Encounter: Payer: Self-pay | Admitting: Physical Therapy

## 2016-10-17 ENCOUNTER — Encounter: Payer: Self-pay | Admitting: Certified Nurse Midwife

## 2016-12-05 ENCOUNTER — Encounter (INDEPENDENT_AMBULATORY_CARE_PROVIDER_SITE_OTHER): Payer: Self-pay

## 2017-10-19 LAB — GLUCOSE, POCT (MANUAL RESULT ENTRY): POC GLUCOSE: 75 mg/dL (ref 70–99)

## 2017-11-03 IMAGING — MR MR CERVICAL SPINE W/O CM
4 of 5 series · 29 of 48 positions shown · non-contrast
Comparison: None.

CLINICAL DATA: Severe neck pain radiating down the left arm with
weakness

EXAM:
MRI CERVICAL SPINE WITHOUT CONTRAST
TECHNIQUE: Multiplanar, multisequence MR imaging of the cervical spine was
performed. No intravenous contrast was administered.

[Series 6: T1 · sagittal · 3.0mm · 0.62mm/px · 8 of 15 slices shown]
[im 1/15]
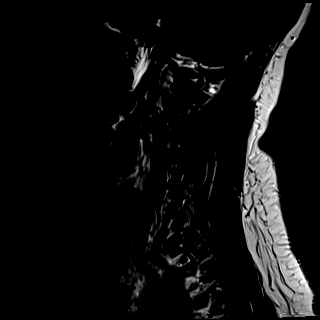
[im 3/15]
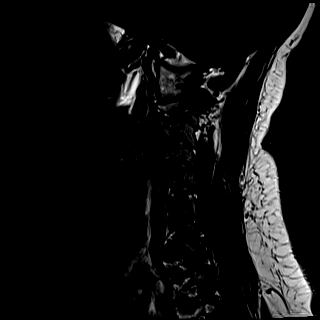
[im 5/15]
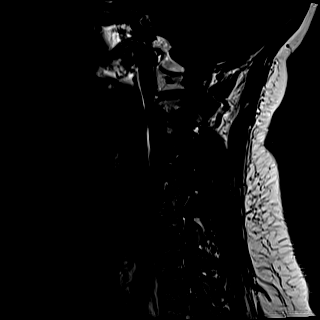
[im 7/15]
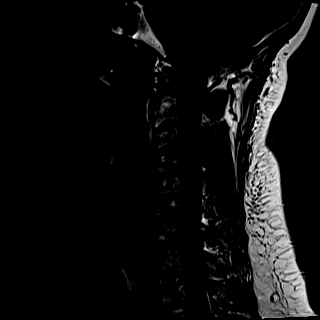
[im 9/15]
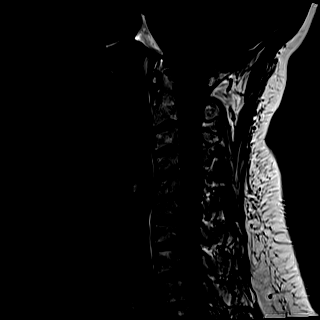
[im 11/15]
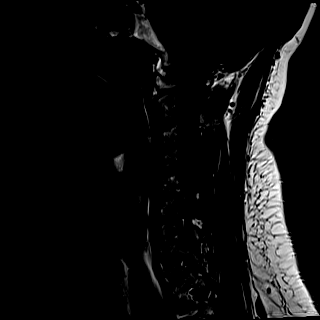
[im 13/15]
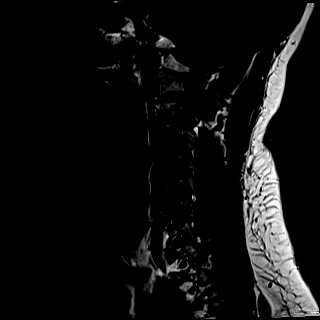
[im 15/15]
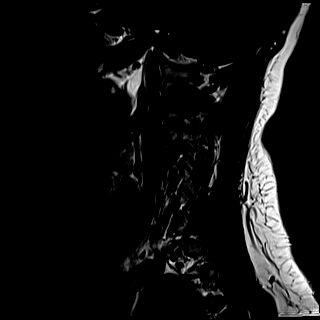

[Series 7: T2 · sagittal · 3.0mm · 0.52mm/px · 7 of 15 slices shown (1 of 2)]
[im 1/15]
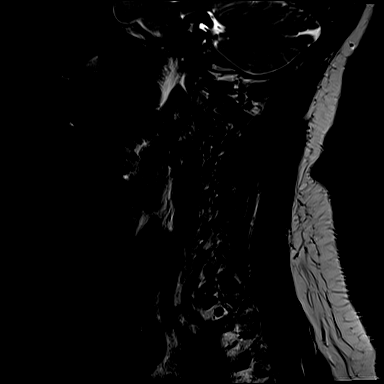
[im 3/15]
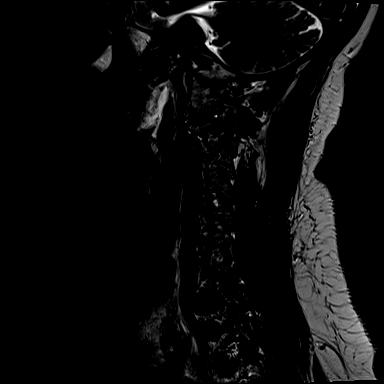
[im 5/15]
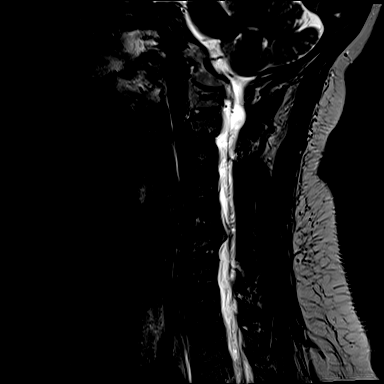
[im 8/15]
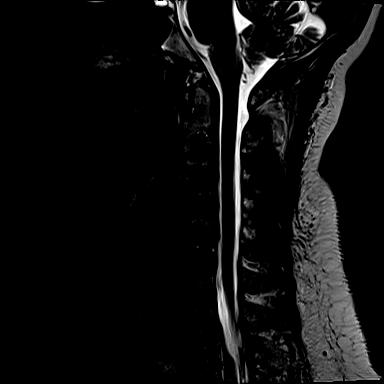
[im 10/15]
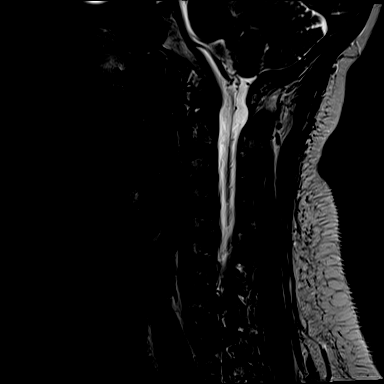
[im 12/15]
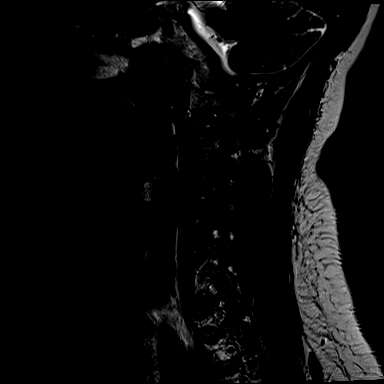
[im 15/15]
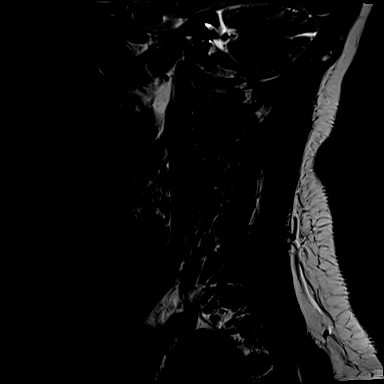

[Series 8: STIR · sagittal · 3.0mm · 0.31mm/px · 5 of 15 slices shown]
[im 1/15]
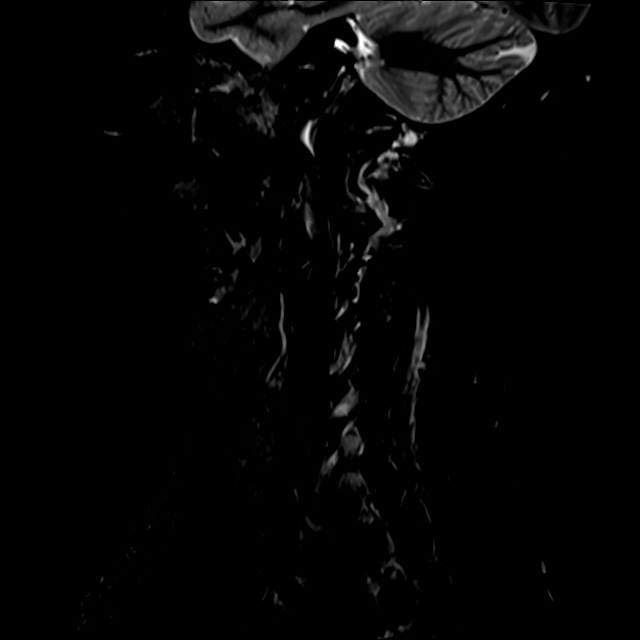
[im 3/15]
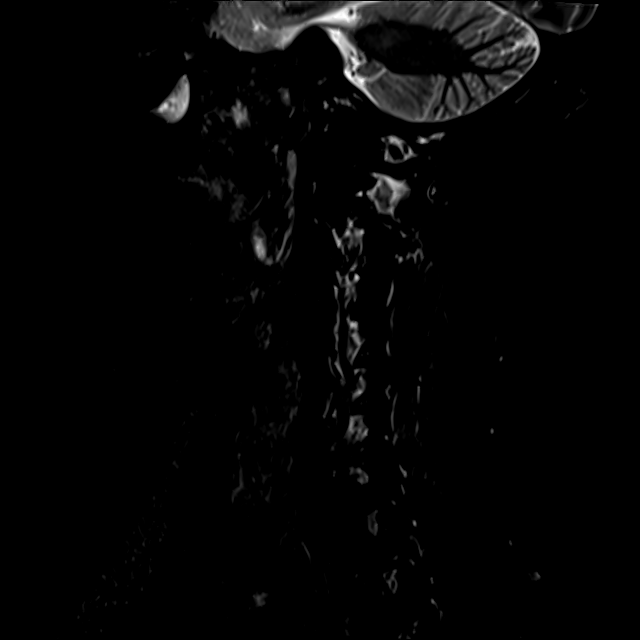
[im 5/15]
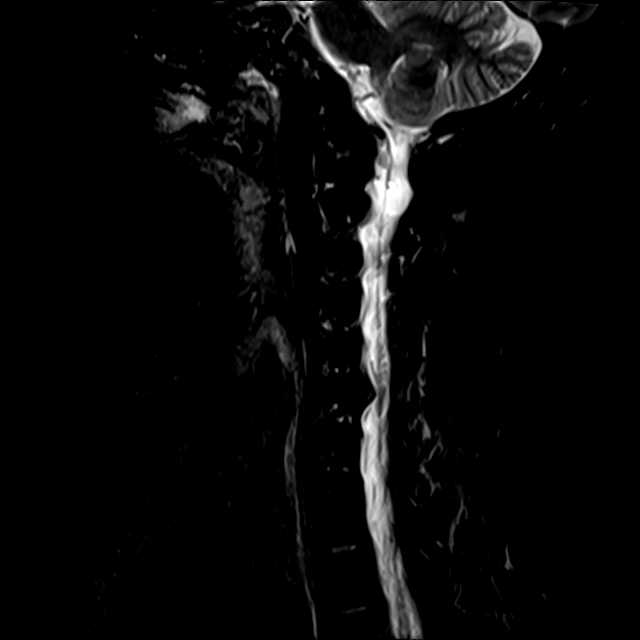
[im 8/15]
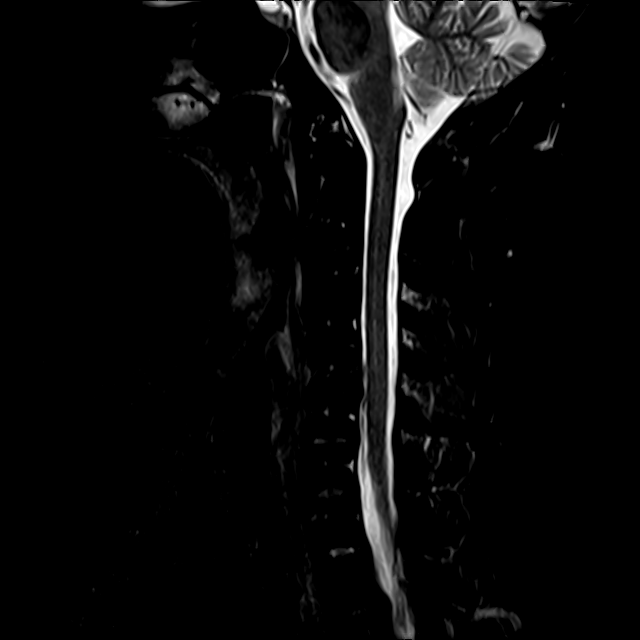
[im 12/15]
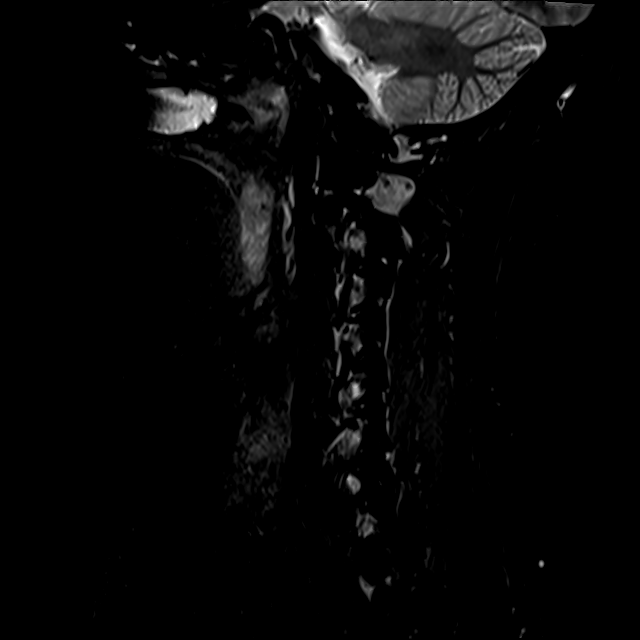

[Series 9: T2 · axial · 3.0mm · 0.50mm/px · z∈[-52,+36]mm · 9 of 28 slices shown (2 of 2)]
[im 1/28]
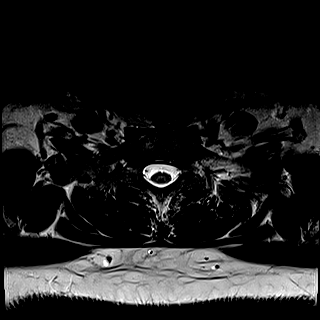
[im 5/28]
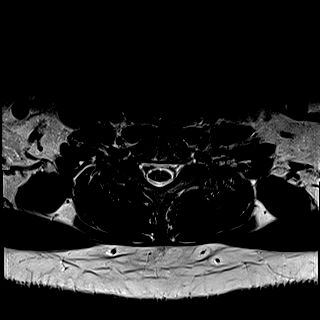
[im 10/28]
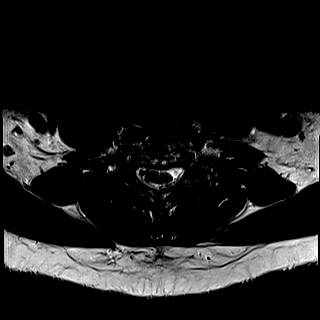
[im 12/28]
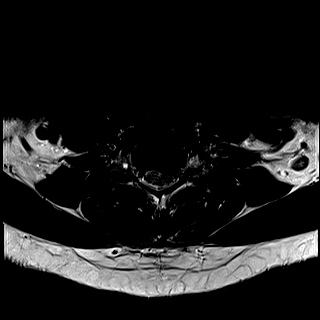
[im 14/28]
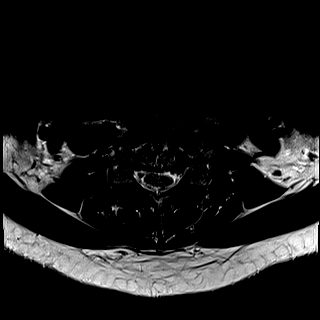
[im 16/28]
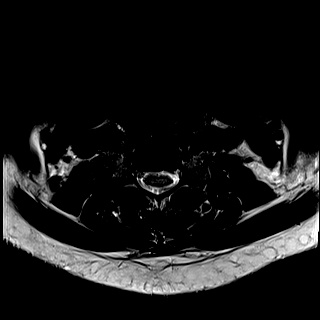
[im 19/28]
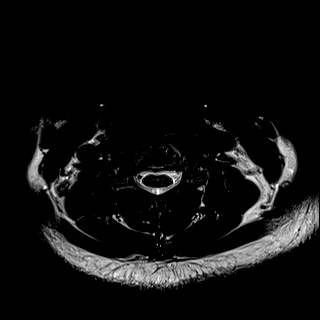
[im 23/28]
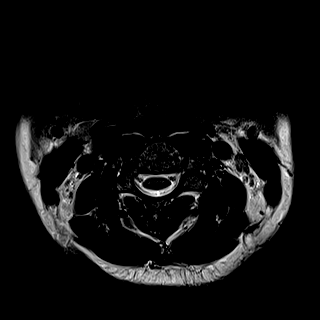
[im 28/28]
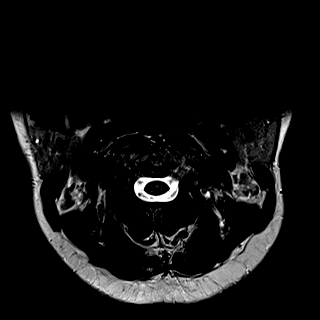

[29 of 48 positions shown; findings below may reference images not displayed]

FINDINGS: Alignment: Physiologic.

Vertebrae: No fracture, evidence of discitis, or bone lesion.

Cord: Normal signal and morphology.

Posterior Fossa, vertebral arteries, paraspinal tissues: Negative.

Disc levels:

Discs: Mild disc height loss at C5-6.

C2-3: No significant disc bulge. No neural foraminal stenosis. No
central canal stenosis.

C3-4: No significant disc bulge. No neural foraminal stenosis. No
central canal stenosis.

C4-5: No significant disc bulge. No neural foraminal stenosis. No
central canal stenosis.

C5-6: Broad left paracentral disc protrusion with mild mass effect
on the left paracentral ventral cervical spinal cord. Moderate left
foraminal stenosis. No central canal stenosis.

C6-7: No significant disc bulge. No neural foraminal stenosis. No
central canal stenosis.

C7-T1: No significant disc bulge. No neural foraminal stenosis. No
central canal stenosis.
IMPRESSION: 1. At C5-6 there is a broad left paracentral disc protrusion with
mild mass effect on the left paracentral ventral cervical spinal
cord. Moderate left foraminal stenosis.

## 2018-01-30 ENCOUNTER — Other Ambulatory Visit: Payer: Self-pay | Admitting: Obstetrics and Gynecology

## 2018-09-11 ENCOUNTER — Other Ambulatory Visit: Payer: Self-pay

## 2018-09-11 DIAGNOSIS — Z20822 Contact with and (suspected) exposure to covid-19: Secondary | ICD-10-CM

## 2018-09-12 LAB — NOVEL CORONAVIRUS, NAA: SARS-CoV-2, NAA: NOT DETECTED

## 2018-11-08 ENCOUNTER — Other Ambulatory Visit: Payer: Self-pay

## 2018-11-08 DIAGNOSIS — Z20822 Contact with and (suspected) exposure to covid-19: Secondary | ICD-10-CM

## 2018-11-10 LAB — NOVEL CORONAVIRUS, NAA: SARS-CoV-2, NAA: NOT DETECTED

## 2018-12-08 ENCOUNTER — Other Ambulatory Visit: Payer: Self-pay

## 2018-12-08 DIAGNOSIS — Z20822 Contact with and (suspected) exposure to covid-19: Secondary | ICD-10-CM

## 2018-12-10 LAB — NOVEL CORONAVIRUS, NAA: SARS-CoV-2, NAA: NOT DETECTED

## 2019-03-30 ENCOUNTER — Ambulatory Visit: Payer: Self-pay

## 2019-12-16 ENCOUNTER — Other Ambulatory Visit

## 2019-12-16 DIAGNOSIS — Z20822 Contact with and (suspected) exposure to covid-19: Secondary | ICD-10-CM

## 2019-12-17 LAB — NOVEL CORONAVIRUS, NAA: SARS-CoV-2, NAA: NOT DETECTED

## 2019-12-17 LAB — SARS-COV-2, NAA 2 DAY TAT

## 2020-06-28 ENCOUNTER — Observation Stay (HOSPITAL_COMMUNITY)
Admission: EM | Admit: 2020-06-28 | Discharge: 2020-06-29 | Disposition: A | Discharge status: Home / Self Care | Payer: BC Managed Care – PPO | Attending: General Surgery | Admitting: General Surgery

## 2020-06-28 ENCOUNTER — Other Ambulatory Visit: Payer: Self-pay

## 2020-06-28 ENCOUNTER — Emergency Department (HOSPITAL_COMMUNITY): Payer: BC Managed Care – PPO

## 2020-06-28 ENCOUNTER — Encounter (HOSPITAL_COMMUNITY): Admission: EM | Disposition: A | Discharge status: Home / Self Care | Payer: Self-pay | Source: Home / Self Care

## 2020-06-28 ENCOUNTER — Emergency Department (HOSPITAL_COMMUNITY): Payer: BC Managed Care – PPO | Admitting: Certified Registered Nurse Anesthetist

## 2020-06-28 ENCOUNTER — Encounter (HOSPITAL_COMMUNITY): Payer: Self-pay | Admitting: Pharmacy Technician

## 2020-06-28 DIAGNOSIS — Z9049 Acquired absence of other specified parts of digestive tract: Secondary | ICD-10-CM

## 2020-06-28 DIAGNOSIS — R1031 Right lower quadrant pain: Secondary | ICD-10-CM | POA: Diagnosis present

## 2020-06-28 DIAGNOSIS — Z20822 Contact with and (suspected) exposure to covid-19: Secondary | ICD-10-CM | POA: Diagnosis not present

## 2020-06-28 DIAGNOSIS — K3580 Unspecified acute appendicitis: Secondary | ICD-10-CM | POA: Diagnosis not present

## 2020-06-28 HISTORY — PX: LAPAROSCOPIC APPENDECTOMY: SHX408

## 2020-06-28 LAB — URINALYSIS, ROUTINE W REFLEX MICROSCOPIC
Bacteria, UA: NONE SEEN
Bilirubin Urine: NEGATIVE
Glucose, UA: NEGATIVE mg/dL
Hgb urine dipstick: NEGATIVE
Ketones, ur: NEGATIVE mg/dL
Nitrite: NEGATIVE
Protein, ur: NEGATIVE mg/dL
Specific Gravity, Urine: 1.018 (ref 1.005–1.030)
pH: 8 (ref 5.0–8.0)

## 2020-06-28 LAB — COMPREHENSIVE METABOLIC PANEL
ALT: 13 U/L (ref 0–44)
AST: 22 U/L (ref 15–41)
Albumin: 3.2 g/dL — ABNORMAL LOW (ref 3.5–5.0)
Alkaline Phosphatase: 80 U/L (ref 38–126)
Anion gap: 5 (ref 5–15)
BUN: 9 mg/dL (ref 6–20)
CO2: 26 mmol/L (ref 22–32)
Calcium: 9.1 mg/dL (ref 8.9–10.3)
Chloride: 108 mmol/L (ref 98–111)
Creatinine, Ser: 0.91 mg/dL (ref 0.44–1.00)
GFR, Estimated: >60 mL/min (ref >60)
Glucose, Bld: 104 mg/dL — ABNORMAL HIGH (ref 70–99)
Potassium: 4.1 mmol/L (ref 3.5–5.1)
Sodium: 139 mmol/L (ref 135–145)
Total Bilirubin: 0.2 mg/dL — ABNORMAL LOW (ref 0.3–1.2)
Total Protein: 7.4 g/dL (ref 6.5–8.1)

## 2020-06-28 LAB — CBC
HCT: 39.7 % (ref 36.0–46.0)
Hemoglobin: 12.4 g/dL (ref 12.0–15.0)
MCH: 30 pg (ref 26.0–34.0)
MCHC: 31.2 g/dL (ref 30.0–36.0)
MCV: 95.9 fL (ref 80.0–100.0)
Platelets: 229 10*3/uL (ref 150–400)
RBC: 4.14 MIL/uL (ref 3.87–5.11)
RDW: 13.3 % (ref 11.5–15.5)
WBC: 12 10*3/uL — ABNORMAL HIGH (ref 4.0–10.5)
nRBC: 0 % (ref 0.0–0.2)

## 2020-06-28 LAB — RESP PANEL BY RT-PCR (FLU A&B, COVID) ARPGX2
Influenza A by PCR: NEGATIVE
Influenza B by PCR: NEGATIVE
SARS Coronavirus 2 by RT PCR: NEGATIVE

## 2020-06-28 LAB — LIPASE, BLOOD: Lipase: 26 U/L (ref 11–51)

## 2020-06-28 SURGERY — APPENDECTOMY, LAPAROSCOPIC
Anesthesia: General | Site: Abdomen

## 2020-06-28 MED ORDER — ONDANSETRON 4 MG PO TBDP: 4.0000 mg | ORAL_TABLET | Freq: Four times a day (QID) | ORAL | Status: DC | PRN

## 2020-06-28 MED ORDER — ROCURONIUM BROMIDE 10 MG/ML (PF) SYRINGE
PREFILLED_SYRINGE | INTRAVENOUS | Status: DC | PRN
  Administered 2020-06-28: 60 mg via INTRAVENOUS

## 2020-06-28 MED ORDER — DEXAMETHASONE SODIUM PHOSPHATE 10 MG/ML IJ SOLN
INTRAMUSCULAR | Status: AC
  Filled 2020-06-28: qty 3

## 2020-06-28 MED ORDER — DEXTROSE-NACL 5-0.9 % IV SOLN: INTRAVENOUS | Status: DC

## 2020-06-28 MED ORDER — BUPIVACAINE HCL (PF) 0.25 % IJ SOLN
INTRAMUSCULAR | Status: AC
  Filled 2020-06-28: qty 30

## 2020-06-28 MED ORDER — ONDANSETRON HCL 4 MG/2ML IJ SOLN
INTRAMUSCULAR | Status: AC
  Filled 2020-06-28: qty 8

## 2020-06-28 MED ORDER — PROPOFOL 10 MG/ML IV BOLUS
INTRAVENOUS | Status: DC | PRN
  Administered 2020-06-28: 110 mg via INTRAVENOUS

## 2020-06-28 MED ORDER — OXYCODONE HCL 5 MG PO TABS
ORAL_TABLET | ORAL | Status: AC
  Administered 2020-06-28: 5 mg via ORAL
  Filled 2020-06-28: qty 1

## 2020-06-28 MED ORDER — CHLORHEXIDINE GLUCONATE 0.12 % MT SOLN
15.0000 mL | Freq: Once | OROMUCOSAL | Status: AC
  Administered 2020-06-28: 15 mL via OROMUCOSAL
  Filled 2020-06-28 (×2): qty 15

## 2020-06-28 MED ORDER — FENTANYL CITRATE (PF) 100 MCG/2ML IJ SOLN: 25.0000 ug | INTRAMUSCULAR | Status: DC | PRN

## 2020-06-28 MED ORDER — MIDAZOLAM HCL 2 MG/2ML IJ SOLN
INTRAMUSCULAR | Status: AC
  Filled 2020-06-28: qty 2

## 2020-06-28 MED ORDER — OXYCODONE HCL 5 MG/5ML PO SOLN: 5.0000 mg | Freq: Once | ORAL | Status: AC | PRN

## 2020-06-28 MED ORDER — OXYCODONE HCL 5 MG PO TABS: 5.0000 mg | ORAL_TABLET | ORAL | Status: DC | PRN

## 2020-06-28 MED ORDER — ONDANSETRON HCL 4 MG/2ML IJ SOLN
4.0000 mg | Freq: Four times a day (QID) | INTRAMUSCULAR | Status: DC | PRN
  Administered 2020-06-28: 4 mg via INTRAVENOUS
  Filled 2020-06-28: qty 2

## 2020-06-28 MED ORDER — SODIUM CHLORIDE 0.9 % IV BOLUS
1000.0000 mL | Freq: Once | INTRAVENOUS | Status: AC
  Administered 2020-06-28: 1000 mL via INTRAVENOUS

## 2020-06-28 MED ORDER — METRONIDAZOLE 500 MG/100ML IV SOLN
500.0000 mg | Freq: Once | INTRAVENOUS | Status: AC
  Administered 2020-06-28: 500 mg via INTRAVENOUS
  Filled 2020-06-28: qty 100

## 2020-06-28 MED ORDER — MIDAZOLAM HCL 2 MG/2ML IJ SOLN
INTRAMUSCULAR | Status: DC | PRN
  Administered 2020-06-28: 2 mg via INTRAVENOUS

## 2020-06-28 MED ORDER — ACETAMINOPHEN 500 MG PO TABS: 1000.0000 mg | ORAL_TABLET | Freq: Once | ORAL | Status: DC | PRN

## 2020-06-28 MED ORDER — PHENYLEPHRINE 40 MCG/ML (10ML) SYRINGE FOR IV PUSH (FOR BLOOD PRESSURE SUPPORT)
PREFILLED_SYRINGE | INTRAVENOUS | Status: AC
  Filled 2020-06-28: qty 10

## 2020-06-28 MED ORDER — ONDANSETRON HCL 4 MG/2ML IJ SOLN
INTRAMUSCULAR | Status: DC | PRN
  Administered 2020-06-28: 4 mg via INTRAVENOUS

## 2020-06-28 MED ORDER — ACETAMINOPHEN 160 MG/5ML PO SOLN: 1000.0000 mg | Freq: Once | ORAL | Status: DC | PRN

## 2020-06-28 MED ORDER — SODIUM CHLORIDE 0.9 % IV SOLN
2.0000 g | Freq: Once | INTRAVENOUS | Status: AC
  Administered 2020-06-28: 2 g via INTRAVENOUS
  Filled 2020-06-28: qty 20

## 2020-06-28 MED ORDER — LIDOCAINE 2% (20 MG/ML) 5 ML SYRINGE
INTRAMUSCULAR | Status: DC | PRN
  Administered 2020-06-28: 40 mg via INTRAVENOUS

## 2020-06-28 MED ORDER — DEXAMETHASONE SODIUM PHOSPHATE 10 MG/ML IJ SOLN
INTRAMUSCULAR | Status: DC | PRN
  Administered 2020-06-28: 10 mg via INTRAVENOUS

## 2020-06-28 MED ORDER — PROPOFOL 10 MG/ML IV BOLUS
INTRAVENOUS | Status: AC
  Filled 2020-06-28: qty 40

## 2020-06-28 MED ORDER — HYDROMORPHONE HCL 1 MG/ML IJ SOLN: 1.0000 mg | INTRAMUSCULAR | Status: DC | PRN

## 2020-06-28 MED ORDER — BUPIVACAINE HCL 0.25 % IJ SOLN
INTRAMUSCULAR | Status: DC | PRN
  Administered 2020-06-28: 4 mL

## 2020-06-28 MED ORDER — OXYCODONE HCL 5 MG PO TABS: 5.0000 mg | ORAL_TABLET | Freq: Once | ORAL | Status: AC | PRN

## 2020-06-28 MED ORDER — IOHEXOL 300 MG/ML  SOLN
100.0000 mL | Freq: Once | INTRAMUSCULAR | Status: AC | PRN
  Administered 2020-06-28: 100 mL via INTRAVENOUS

## 2020-06-28 MED ORDER — ORAL CARE MOUTH RINSE: 15.0000 mL | Freq: Once | OROMUCOSAL | Status: AC

## 2020-06-28 MED ORDER — FENTANYL CITRATE (PF) 250 MCG/5ML IJ SOLN
INTRAMUSCULAR | Status: AC
  Filled 2020-06-28: qty 5

## 2020-06-28 MED ORDER — ACETAMINOPHEN 10 MG/ML IV SOLN: 1000.0000 mg | Freq: Once | INTRAVENOUS | Status: DC | PRN

## 2020-06-28 MED ORDER — SODIUM CHLORIDE 0.9 % IR SOLN
Status: DC | PRN
  Administered 2020-06-28: 1000 mL

## 2020-06-28 MED ORDER — LACTATED RINGERS IV SOLN: INTRAVENOUS | Status: DC

## 2020-06-28 MED ORDER — ROCURONIUM BROMIDE 10 MG/ML (PF) SYRINGE
PREFILLED_SYRINGE | INTRAVENOUS | Status: AC
  Filled 2020-06-28: qty 20

## 2020-06-28 MED ORDER — FENTANYL CITRATE (PF) 250 MCG/5ML IJ SOLN
INTRAMUSCULAR | Status: DC | PRN
  Administered 2020-06-28: 100 ug via INTRAVENOUS
  Administered 2020-06-28 (×3): 50 ug via INTRAVENOUS

## 2020-06-28 MED ORDER — 0.9 % SODIUM CHLORIDE (POUR BTL) OPTIME
TOPICAL | Status: DC | PRN
  Administered 2020-06-28: 1000 mL

## 2020-06-28 MED ORDER — LIDOCAINE HCL (PF) 2 % IJ SOLN
INTRAMUSCULAR | Status: AC
  Filled 2020-06-28: qty 10

## 2020-06-28 SURGICAL SUPPLY — 43 items
APPLIER CLIP 5 13 M/L LIGAMAX5 (MISCELLANEOUS)
BLADE CLIPPER SURG (BLADE) IMPLANT
CANISTER SUCT 3000ML PPV (MISCELLANEOUS) ×2 IMPLANT
CHLORAPREP W/TINT 26 (MISCELLANEOUS) ×2 IMPLANT
CLIP APPLIE 5 13 M/L LIGAMAX5 (MISCELLANEOUS) IMPLANT
CLIP VESOLOCK XL 6/CT (CLIP) ×2 IMPLANT
COVER SURGICAL LIGHT HANDLE (MISCELLANEOUS) ×2 IMPLANT
COVER TRANSDUCER ULTRASND (DRAPES) IMPLANT
COVER WAND RF STERILE (DRAPES) ×2 IMPLANT
DERMABOND ADVANCED (GAUZE/BANDAGES/DRESSINGS) ×1
DERMABOND ADVANCED .7 DNX12 (GAUZE/BANDAGES/DRESSINGS) ×1 IMPLANT
ELECT REM PT RETURN 9FT ADLT (ELECTROSURGICAL) ×2
ELECTRODE REM PT RTRN 9FT ADLT (ELECTROSURGICAL) ×1 IMPLANT
ENDOLOOP SUT PDS II  0 18 (SUTURE)
ENDOLOOP SUT PDS II 0 18 (SUTURE) IMPLANT
GLOVE BIO SURGEON STRL SZ7.5 (GLOVE) ×2 IMPLANT
GOWN STRL REUS W/ TWL LRG LVL3 (GOWN DISPOSABLE) ×1 IMPLANT
GOWN STRL REUS W/ TWL XL LVL3 (GOWN DISPOSABLE) ×1 IMPLANT
GOWN STRL REUS W/TWL LRG LVL3 (GOWN DISPOSABLE) ×1
GOWN STRL REUS W/TWL XL LVL3 (GOWN DISPOSABLE) ×1
GRASPER SUT TROCAR 14GX15 (MISCELLANEOUS) ×2 IMPLANT
KIT BASIN OR (CUSTOM PROCEDURE TRAY) ×2 IMPLANT
KIT TURNOVER KIT B (KITS) ×2 IMPLANT
NEEDLE INSUFFLATION 14GA 120MM (NEEDLE) ×2 IMPLANT
NS IRRIG 1000ML POUR BTL (IV SOLUTION) ×2 IMPLANT
PAD ARMBOARD 7.5X6 YLW CONV (MISCELLANEOUS) ×4 IMPLANT
POUCH RETRIEVAL ECOSAC 10 (ENDOMECHANICALS) ×1 IMPLANT
POUCH RETRIEVAL ECOSAC 10MM (ENDOMECHANICALS) ×1
SCISSORS LAP 5X35 DISP (ENDOMECHANICALS) ×2 IMPLANT
SET IRRIG TUBING LAPAROSCOPIC (IRRIGATION / IRRIGATOR) ×2 IMPLANT
SET TUBE SMOKE EVAC HIGH FLOW (TUBING) ×2 IMPLANT
SLEEVE ENDOPATH XCEL 5M (ENDOMECHANICALS) ×2 IMPLANT
SPECIMEN JAR SMALL (MISCELLANEOUS) ×2 IMPLANT
SUT MNCRL AB 4-0 PS2 18 (SUTURE) ×2 IMPLANT
TOWEL GREEN STERILE (TOWEL DISPOSABLE) ×2 IMPLANT
TOWEL GREEN STERILE FF (TOWEL DISPOSABLE) ×2 IMPLANT
TRAY FOLEY W/BAG SLVR 16FR (SET/KITS/TRAYS/PACK)
TRAY FOLEY W/BAG SLVR 16FR ST (SET/KITS/TRAYS/PACK) IMPLANT
TRAY LAPAROSCOPIC MC (CUSTOM PROCEDURE TRAY) ×2 IMPLANT
TROCAR XCEL NON-BLD 11X100MML (ENDOMECHANICALS) ×2 IMPLANT
TROCAR XCEL NON-BLD 5MMX100MML (ENDOMECHANICALS) ×2 IMPLANT
WARMER LAPAROSCOPE (MISCELLANEOUS) ×2 IMPLANT
WATER STERILE IRR 1000ML POUR (IV SOLUTION) ×2 IMPLANT

## 2020-06-28 NOTE — Anesthesia Preprocedure Evaluation (Addendum)
Anesthesia Evaluation  Patient identified by MRN, date of birth, ID band Patient awake    Reviewed: Allergy & Precautions, NPO status , Patient's Chart, lab work & pertinent test results  History of Anesthesia Complications Negative for: history of anesthetic complications  Airway Mallampati: II  TM Distance: >3 FB Neck ROM: Full    Dental  (+) Dental Advisory Given, Teeth Intact   Pulmonary neg pulmonary ROS,  Covid-19 Nucleic Acid Test Results Lab Results      Component                Value               Date                      SARSCOV2NAA              NEGATIVE            06/28/2020                SARSCOV2NAA              Not Detected        12/16/2019                SARSCOV2NAA              Not Detected        12/08/2018                SARSCOV2NAA              Not Detected        11/08/2018                SARSCOV2NAA              Not Detected        09/11/2018              breath sounds clear to auscultation       Cardiovascular negative cardio ROS   Rhythm:Regular     Neuro/Psych  Neuromuscular disease negative psych ROS   GI/Hepatic Neg liver ROS, GERD  Medicated,appendicitis   Endo/Other  negative endocrine ROS  Renal/GU negative Renal ROS     Musculoskeletal negative musculoskeletal ROS (+)   Abdominal   Peds  Hematology negative hematology ROS (+)   Anesthesia Other Findings   Reproductive/Obstetrics                            Anesthesia Physical Anesthesia Plan  ASA: 1  Anesthesia Plan: General   Post-op Pain Management:    Induction: Intravenous  PONV Risk Score and Plan: 3 and Ondansetron, Dexamethasone and Midazolam  Airway Management Planned: Oral ETT  Additional Equipment: None  Intra-op Plan:   Post-operative Plan: Extubation in OR  Informed Consent: I have reviewed the patients History and Physical, chart, labs and discussed the procedure including  the risks, benefits and alternatives for the proposed anesthesia with the patient or authorized representative who has indicated his/her understanding and acceptance.     Dental advisory given  Plan Discussed with: CRNA and Surgeon  Anesthesia Plan Comments:         Anesthesia Quick Evaluation

## 2020-06-28 NOTE — Progress Notes (Signed)
Pt arrived to room 6N32 via bed after surgery. Received report from [place], RN in PACU. See assessment. Will continue to monitor.

## 2020-06-28 NOTE — ED Notes (Signed)
Patient transported to CT 

## 2020-06-28 NOTE — ED Provider Notes (Signed)
[facility] EMERGENCY DEPARTMENT Provider Note   CSN: [id number] Arrival date & time: 06/28/20  1093     History Chief Complaint  Patient presents with   Abdominal Pain    Kathryn Mcclain is a 60 y.o. female.  HPI 60 year old female with G6PD deficiency, GERD, presents today complaining of right lower quadrant pain that woke her from sleep about 4 AM.  She describes it as crampy in the epigastrium but then located in the right lower quadrant.  She became nauseated and vomited.  She had a normal bowel movement.  She had another normal bowel movement several hours later.  She is continued to have pain in the right lower quadrant.  She presents secondary to this.  She had labs drawn prior to my evaluation shows a leukocytosis of 12,000.  She denies any urinary tract infection symptoms, hematuria, frequency of urination, fever, chills.  She denies any chest pain, cough, history of COVID.  She has been vaccinated for COVID and has had a 1 booster.  She has previously had a cholecystectomy and had a C-section.     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   G6PD deficiency    GERD (gastroesophageal reflux disease)     Patient Active Problem List   Diagnosis Date Noted   Cervical radiculopathy 03/14/2016    Past Surgical History:  Procedure Laterality Date   BREAST BIOPSY Right 1989   CESAREAN SECTION  1985   CHOLECYSTECTOMY  2003   COLONOSCOPY  2013   benign polyp   COSMETIC SURGERY  2005   mini tummy tuck   REDUCTION MAMMAPLASTY  1998   TUBAL LIGATION  2003     OB History     Gravida  1   Para  1   Term  1   Preterm      AB      Living  1      SAB      IAB      Ectopic      Multiple      Live Births  1        Obstetric Comments  1st Menstrual Cycle:  15  1st Pregnancy:  22          Family History  Problem Relation Age of Onset   Kidney failure Father    Hypertension Father     Hypertension Mother    Breast cancer Maternal Aunt        maternal great aunt   Breast cancer Maternal Aunt        maternal great aunt   Breast cancer Maternal Aunt        maternal great aunt   Breast cancer Maternal Aunt        maternal great aunt   Hypertension Brother    Hypertension Brother    Hypertension Brother    Diabetes Brother    Breast cancer Maternal Aunt        maternal great aunt    Social History   Tobacco Use   Smoking status: Never   Smokeless tobacco: Never  Vaping Use   Vaping Use: Never used  Substance Use Topics   Alcohol use: No    Alcohol/week: 0.0 standard drinks   Drug use: No    Home Medications Prior to Admission medications   Medication Sig Start Date End Date Taking? Authorizing Provider  acetaminophen (TYLENOL) 500 MG  tablet Take 1,000 mg by mouth every 6 (six) hours as needed for moderate pain or headache.    [provider]  Omega 3-6-9 Fatty Acids (OMEGA 3-6-9 COMPLEX PO) Take 1 tablet by mouth daily.    [provider]    Allergies    Patient has no known allergies.  Review of Systems   Review of Systems  All other systems reviewed and are negative.  Physical Exam Updated Vital Signs BP (!) 151/89   Pulse 65   Temp 99 F (37.2 C)   Resp 20   LMP 01/17/2013 (Within Months)   SpO2 92%   Physical Exam Vitals and nursing note reviewed.  Constitutional:      Appearance: She is well-developed. She is obese.  HENT:     Head: Normocephalic.  Eyes:     Extraocular Movements: Extraocular movements intact.  Cardiovascular:     Rate and Rhythm: Normal rate and regular rhythm.  Abdominal:     General: Abdomen is flat. Bowel sounds are normal.     Palpations: Abdomen is soft.     Tenderness: There is abdominal tenderness in the right lower quadrant. There is rebound. There is no guarding.     Hernia: No hernia is present.  Skin:    General: Skin is warm and dry.     Capillary Refill: Capillary refill  takes less than 2 seconds.  Neurological:     General: No focal deficit present.     Mental Status: She is alert.  Psychiatric:        Mood and Affect: Mood normal.        Behavior: Behavior normal.    ED Results / Procedures / Treatments   Labs (all labs ordered are listed, but only abnormal results are displayed) Labs Reviewed  COMPREHENSIVE METABOLIC PANEL - Abnormal; Notable for the following components:      Result Value   Glucose, Bld 104 (*)    Albumin 3.2 (*)    Total Bilirubin 0.2 (*)    All other components within normal limits  CBC - Abnormal; Notable for the following components:   WBC 12.0 (*)    All other components within normal limits  LIPASE, BLOOD  URINALYSIS, ROUTINE W REFLEX MICROSCOPIC    EKG None  Radiology CT ABDOMEN PELVIS W CONTRAST  Result Date: 06/28/2020 CLINICAL DATA:  Right lower quadrant abdominal pain. EXAM: CT ABDOMEN AND PELVIS WITH CONTRAST TECHNIQUE: Multidetector CT imaging of the abdomen and pelvis was performed using the standard protocol following bolus administration of intravenous contrast. CONTRAST:  OMNIPAQUE IOHEXOL 300 MG/ML  SOLN COMPARISON:  None. FINDINGS: Lower chest: The lung bases are clear of acute process. No pleural effusion or pulmonary lesions. The heart is normal in size. No pericardial effusion. The distal esophagus and aorta are unremarkable. Hepatobiliary: No hepatic lesions or intrahepatic biliary dilatation. The gallbladder is surgically absent. No common bile duct dilatation. Pancreas: No mass, inflammation or ductal dilatation. Spleen: Normal size.  No focal lesions. Adrenals/Urinary Tract: The adrenal glands and kidneys are unremarkable. Small upper pole left renal cyst is noted. No renal calculi or hydroureteronephrosis. Bladder is unremarkable. Stomach/Bowel: The stomach, duodenum, small bowel and colon are unremarkable. No acute inflammatory changes, mass lesions or obstructive findings. The terminal ileum is  normal. Findings consistent with acute appendicitis. The appendix is distended and demonstrates significant mucosal enhancement and significant periappendiceal inflammatory changes. No findings for rupture/perforation. There is a small appendicoliths noted near the tip. There is  some surrounding fluid extending down to the pelvis but no definite abscess. Mild secondary inflammation of some of the surrounding small bowel loops and sigmoid colon. Adjacent reactive ileocolic lymph nodes are noted. Vascular/Lymphatic: The aorta is normal in caliber. No dissection. The branch vessels are patent. The major venous structures are patent. No mesenteric or retroperitoneal mass or adenopathy. Small scattered lymph nodes are noted. Reproductive: Multiple uterine fibroids. The ovaries are unremarkable. Other: Small amount of free pelvic fluid but no pelvic abscess. No abdominal wall hernia or subcutaneous lesions. Musculoskeletal: No significant bony findings. IMPRESSION: 1. CT findings consistent with acute appendicitis. No findings for rupture/perforation. 2. Status post cholecystectomy. No biliary dilatation. 3. Multiple uterine fibroids. Electronically Signed   By: Rudie Meyer M.D.   On: 06/28/2020 13:06    Procedures Procedures   Medications Ordered in ED Medications - No data to display  ED Course  I have reviewed the triage vital signs and the nursing notes.  Pertinent labs & imaging results that were available during my care of the patient were reviewed by me and considered in my medical decision making (see chart for details).    MDM Rules/Calculators/A&P                         60 year old female presents today with right lower quadrant pain CT with appendicitis.  IV antibiotics ordered.  Discussed with Dr. Derrell Lolling who will see for surgery.  Final Clinical Impression(s) / ED Diagnoses Final diagnoses:  Acute appendicitis, unspecified acute appendicitis type    Rx / DC Orders ED Discharge  Orders     None        Margarita Grizzle, MD 06/28/20 1320

## 2020-06-28 NOTE — Plan of Care (Signed)
  Problem: Education: Goal: Knowledge of General Education information will improve Description: Including pain rating scale, medication(s)/side effects and non-pharmacologic comfort measures Outcome: Progressing   Problem: Health Behavior/Discharge Planning: Goal: Ability to manage health-related needs will improve Outcome: Progressing   Problem: Clinical Measurements: Goal: Ability to maintain clinical measurements within normal limits will improve Outcome: Progressing Goal: Respiratory complications will improve Outcome: Progressing Goal: Cardiovascular complication will be avoided Outcome: Progressing   Problem: Nutrition: Goal: Adequate nutrition will be maintained Outcome: Progressing   Problem: Pain Managment: Goal: General experience of comfort will improve Outcome: Progressing   Problem: Safety: Goal: Ability to remain free from injury will improve Outcome: Progressing   Problem: Skin Integrity: Goal: Risk for impaired skin integrity will decrease Outcome: Progressing   

## 2020-06-28 NOTE — Op Note (Signed)
06/28/2020  3:51 PM  PATIENT:  Kathryn Mcclain  60 y.o. female  PRE-OPERATIVE DIAGNOSIS:  acute appendicitis  POST-OPERATIVE DIAGNOSIS:  acute appendicitis  PROCEDURE:  Procedure(s): APPENDECTOMY LAPAROSCOPIC (N/A)  SURGEON:  Surgeon(s) and Role:    * Axel Filler, MD - Primary  ANESTHESIA:   local and general  EBL:  5 mL   BLOOD ADMINISTERED:none  DRAINS: none   LOCAL MEDICATIONS USED:  BUPIVICAINE   SPECIMEN:  Source of Specimen:  appendix  DISPOSITION OF SPECIMEN:  PATHOLOGY  COUNTS:  YES  TOURNIQUET:  * No tourniquets in log *  DICTATION: .Dragon Dictation Complications: none  Counts: reported as correct x 2  Findings:  The patient had an acutely inflamed, very scarred appendix to the right retroperitoneum.  There was no abscess.  Specimen: Appendix  Indications for procedure:  The patient is a 61 year old female with a history of periumbilical pain localized in the right lower quadrant patient had a CT scan which revealed signs consistent with acute appendicitis the patient back in for laparoscopic appendectomy.  Details of the procedure:The patient was taken back to the operating room. The patient was placed in supine position with bilateral SCDs in place.  A foley catheter was place. The patient was prepped and draped in the usual sterile fashion.  After appropriate anitbiotics were confirmed, a time-out was confirmed and all facts were verified.    A pneumoperitoneum of 14 mmHg was obtained via a Veress needle technique in the left lower quadrant quadrant.  A 5 mm trocar and 5 mm camera then placed intra-abdominally there is no injury to any intra-abdominal organs a 10 mm infraumbilical port was placed and direct visualization there was some omental adhesions that were adherent to the lower midline.  These were lysed sharply.  At this time a 5 mm port in the suprapubic area.   The appendix was visualized.  This was seen to be very adherent to the right  pelvic retroperitoneum.  I was able to trace the tinea coli down to the appendix base.  At this time a mesenteric window was made.  I was able to place 2 hemoclips proximally 1 distally.  I was able to transect the appendix at this area.  I cauterized the mucosa.  At this time I was able to retract and dissect the appendix circumferentially.  There was significant fibrinous reaction to this area.  There appeared to be no perforation and no purulence that could be seen.  The entire appendix was liberated.  At this time a retrieval bag was then placed into the abdomen and the specimen placed in the bag.  There was significant adhesions in the pelvis.  There is no fluid that could be seen in the pelvis.    The appendix and retrieval  bag was then retrieved via the supraumbilical port. #1 Vicryl was used to reapproximate the fascia at the umbilical port site x2. The skin was reapproximated all port sites 3-0 Monocryl subcuticular fashion. The skin was dressed with Dermabond.  The patient had the foley removed. The patient was awakened from general anesthesia was taken to recovery room in stable condition.     PLAN OF CARE: Admit for overnight observation  PATIENT DISPOSITION:  PACU - hemodynamically stable.   Delay start of Pharmacological VTE agent (>24hrs) due to surgical blood loss or risk of bleeding: not applicable

## 2020-06-28 NOTE — Transfer of Care (Signed)
Immediate Anesthesia Transfer of Care Note  Patient: Kathryn Mcclain  Procedure(s) Performed: APPENDECTOMY LAPAROSCOPIC (Abdomen)  Patient Location: PACU  Anesthesia Type:General  Level of Consciousness: awake, alert  and oriented  Airway & Oxygen Therapy: Patient Spontanous Breathing  Post-op Assessment: Report given to RN and Post -op Vital signs reviewed and stable  Post vital signs: Reviewed and stable  Last Vitals:  Vitals Value Taken Time  BP 110/77   Temp    Pulse 80 06/28/20 1603  Resp 14 06/28/20 1603  SpO2 98 % 06/28/20 1603  Vitals shown include unvalidated device data.  Last Pain:  Vitals:   06/28/20 1431  TempSrc:   PainSc: 4       Patients Stated Pain Goal: 5 (06/28/20 1431)  Complications: No notable events documented.

## 2020-06-28 NOTE — Anesthesia Procedure Notes (Signed)
Procedure Name: Intubation Date/Time: 06/28/2020 2:46 PM Performed by: Candis Shine, CRNA Pre-anesthesia Checklist: Patient identified, Emergency Drugs available, Suction available and Patient being monitored Patient Re-evaluated:Patient Re-evaluated prior to induction Oxygen Delivery Method: Circle System Utilized Preoxygenation: Pre-oxygenation with 100% oxygen Induction Type: IV induction Ventilation: Mask ventilation without difficulty Laryngoscope Size: Mac and 3 Grade View: Grade II Tube type: Oral Tube size: 7.0 mm Number of attempts: 1 Airway Equipment and Method: Stylet Placement Confirmation: ETT inserted through vocal cords under direct vision, positive ETCO2 and breath sounds checked- equal and bilateral Secured at: 22 cm Tube secured with: Tape Dental Injury: Teeth and Oropharynx as per pre-operative assessment

## 2020-06-28 NOTE — Anesthesia Postprocedure Evaluation (Signed)
Anesthesia Post Note  Patient: Kathryn Mcclain  Procedure(s) Performed: APPENDECTOMY LAPAROSCOPIC (Abdomen)     Patient location during evaluation: PACU Anesthesia Type: General Level of consciousness: awake and alert Pain management: pain level controlled Vital Signs Assessment: post-procedure vital signs reviewed and stable Respiratory status: spontaneous breathing, nonlabored ventilation, respiratory function stable and patient connected to nasal cannula oxygen Cardiovascular status: blood pressure returned to baseline and stable Postop Assessment: no apparent nausea or vomiting Anesthetic complications: no   No notable events documented.  Last Vitals:  Vitals:   06/28/20 1702 06/28/20 1721  BP: (!) 145/74 128/73  Pulse: 75 69  Resp: 16 15  Temp: 36.9 C   SpO2: 95% 100%    Last Pain:  Vitals:   06/28/20 1721  TempSrc: Oral  PainSc: 0-No pain                 Wilmetta Speiser

## 2020-06-28 NOTE — H&P (Signed)
Kathryn Mcclain is an 60 y.o. female.   Chief Complaint: Patient is a 60 year old female, who comes in with a chief complaint of abdominal pain HPI: 60 year old female with a 12-hour history of abdominal pain.  Patient states that the pain was mainly in right lower quadrant area.  Patient states that she did have some associated nausea vomiting.  She states that thereafter pain was more epigastric secondary to nausea and vomiting.  Patient denies any fevers or chills.  Patient underwent CT scan in the ER was found to have acute appendicitis.  I did review the CT scans personally.  Patient also with leukocytosis.  General surgery was consulted for evaluation and management.  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   G6PD deficiency    GERD (gastroesophageal reflux disease)     Past Surgical History:  Procedure Laterality Date   BREAST BIOPSY Right 1989   CESAREAN SECTION  1985   CHOLECYSTECTOMY  2003   COLONOSCOPY  2013   benign polyp   COSMETIC SURGERY  2005   mini tummy tuck   REDUCTION MAMMAPLASTY  1998   TUBAL LIGATION  2003    Family History  Problem Relation Age of Onset   Kidney failure Father    Hypertension Father    Hypertension Mother    Breast cancer Maternal Aunt        maternal great aunt   Breast cancer Maternal Aunt        maternal great aunt   Breast cancer Maternal Aunt        maternal great aunt   Breast cancer Maternal Aunt        maternal great aunt   Hypertension Brother    Hypertension Brother    Hypertension Brother    Diabetes Brother    Breast cancer Maternal Aunt        maternal great aunt   Social History:  reports that she has never smoked. She has never used smokeless tobacco. She reports that she does not drink alcohol and does not use drugs.  Allergies: No Known Allergies  (Not in a hospital admission)   Results for orders placed or performed during the hospital encounter of  06/28/20 (from the past 48 hour(s))  Lipase, blood     Status: None   Collection Time: 06/28/20  8:50 AM  Result Value Ref Range   Lipase 26 11 - 51 U/L    Comment: Performed at [facility], [address] [place], [place] [address]  Comprehensive metabolic panel     Status: Abnormal   Collection Time: 06/28/20  8:50 AM  Result Value Ref Range   Sodium 139 135 - 145 mmol/L   Potassium 4.1 3.5 - 5.1 mmol/L   Chloride 108 98 - 111 mmol/L   CO2 26 22 - 32 mmol/L   Glucose, Bld 104 (H) 70 - 99 mg/dL    Comment: Glucose reference range applies only to samples taken after fasting for at least 8 hours.   BUN 9 6 - 20 mg/dL   Creatinine, Ser 3.47 0.44 - 1.00 mg/dL   Calcium 9.1 8.9 - 42.5 mg/dL   Total Protein 7.4 6.5 - 8.1 g/dL   Albumin 3.2 (L) 3.5 - 5.0 g/dL   AST 22 15 - 41 U/L   ALT 13 0 - 44 U/L   Alkaline Phosphatase 80 38 - 126 U/L   Total Bilirubin 0.2 (  L) 0.3 - 1.2 mg/dL   GFR, Estimated >96>60 >04>60 mL/min    Comment: (NOTE) Calculated using the CKD-EPI Creatinine Equation (2021)    Anion gap 5 5 - 15    Comment: Performed at [facility], [address] [place], [place] [address]  CBC     Status: Abnormal   Collection Time: 06/28/20  8:50 AM  Result Value Ref Range   WBC 12.0 (H) 4.0 - 10.5 K/uL   RBC 4.14 3.87 - 5.11 MIL/uL   Hemoglobin 12.4 12.0 - 15.0 g/dL   HCT 11.939.7 14.736.0 - 82.946.0 %   MCV 95.9 80.0 - 100.0 fL   MCH 30.0 26.0 - 34.0 pg   MCHC 31.2 30.0 - 36.0 g/dL   RDW 56.213.3 13.011.5 - 86.515.5 %   Platelets 229 150 - 400 K/uL   nRBC 0.0 0.0 - 0.2 %    Comment: Performed at [facility], [address] [place], [place] [address]  Resp Panel by RT-PCR (Flu A&B, Covid) Nasopharyngeal Swab     Status: None   Collection Time: 06/28/20 10:49 AM   Specimen: Nasopharyngeal Swab; Nasopharyngeal(NP) swabs in vial transport medium  Result Value Ref Range   SARS Coronavirus 2 by RT PCR NEGATIVE NEGATIVE    Comment: (NOTE) SARS-CoV-2 target nucleic acids are NOT  DETECTED.  The SARS-CoV-2 RNA is generally detectable in upper respiratory specimens during the acute phase of infection. The lowest concentration of SARS-CoV-2 viral copies this assay can detect is 138 copies/mL. A negative result does not preclude SARS-Cov-2 infection and should not be used as the sole basis for treatment or other patient management decisions. A negative result may occur with  improper specimen collection/handling, submission of specimen other than nasopharyngeal swab, presence of viral mutation(s) within the areas targeted by this assay, and inadequate number of viral copies(<138 copies/mL). A negative result must be combined with clinical observations, patient history, and epidemiological information. The expected result is Negative.  Fact Sheet for Patients:  BloggerCourse.comhttps://www.fda.gov/media/152166/download  Fact Sheet for Healthcare Providers:  SeriousBroker.ithttps://www.fda.gov/media/152162/download  This test is no t yet approved or cleared by the [place] FDA and  has been authorized for detection and/or diagnosis of SARS-CoV-2 by FDA under an Emergency Use Authorization (EUA). This EUA will remain  in effect (meaning this test can be used) for the duration of the COVID-19 declaration under Section 564(b)(1) of the Act, 21 U.S.C.section 360bbb-3(b)(1), unless the authorization is terminated  or revoked sooner.       Influenza A by PCR NEGATIVE NEGATIVE   Influenza B by PCR NEGATIVE NEGATIVE    Comment: (NOTE) The Xpert Xpress SARS-CoV-2/FLU/RSV plus assay is intended as an aid in the diagnosis of influenza from Nasopharyngeal swab specimens and should not be used as a sole basis for treatment. Nasal washings and aspirates are unacceptable for Xpert Xpress SARS-CoV-2/FLU/RSV testing.  Fact Sheet for Patients: BloggerCourse.comhttps://www.fda.gov/media/152166/download  Fact Sheet for Healthcare Providers: SeriousBroker.ithttps://www.fda.gov/media/152162/download  This test is not yet approved or  cleared by the [place] FDA and has been authorized for detection and/or diagnosis of SARS-CoV-2 by FDA under an Emergency Use Authorization (EUA). This EUA will remain in effect (meaning this test can be used) for the duration of the COVID-19 declaration under Section 564(b)(1) of the Act, 21 U.S.C. section 360bbb-3(b)(1), unless the authorization is terminated or revoked.  Performed at [facility], [address] [place], [place] [address]   Urinalysis, Routine w reflex microscopic Urine, Clean Catch  Status: Abnormal   Collection Time: 06/28/20 11:33 AM  Result Value Ref Range   Color, Urine YELLOW YELLOW   APPearance HAZY (A) CLEAR   Specific Gravity, Urine 1.018 1.005 - 1.030   pH 8.0 5.0 - 8.0   Glucose, UA NEGATIVE NEGATIVE mg/dL   Hgb urine dipstick NEGATIVE NEGATIVE   Bilirubin Urine NEGATIVE NEGATIVE   Ketones, ur NEGATIVE NEGATIVE mg/dL   Protein, ur NEGATIVE NEGATIVE mg/dL   Nitrite NEGATIVE NEGATIVE   Leukocytes,Ua TRACE (A) NEGATIVE   RBC / HPF 0-5 0 - 5 RBC/hpf   WBC, UA 0-5 0 - 5 WBC/hpf   Bacteria, UA NONE SEEN NONE SEEN   Squamous Epithelial / LPF 0-5 0 - 5    Comment: Performed at [facility], [address] [place], [place] [address]   CT ABDOMEN PELVIS W CONTRAST  Result Date: 06/28/2020 CLINICAL DATA:  Right lower quadrant abdominal pain. EXAM: CT ABDOMEN AND PELVIS WITH CONTRAST TECHNIQUE: Multidetector CT imaging of the abdomen and pelvis was performed using the standard protocol following bolus administration of intravenous contrast. CONTRAST:  OMNIPAQUE IOHEXOL 300 MG/ML  SOLN COMPARISON:  None. FINDINGS: Lower chest: The lung bases are clear of acute process. No pleural effusion or pulmonary lesions. The heart is normal in size. No pericardial effusion. The distal esophagus and aorta are unremarkable. Hepatobiliary: No hepatic lesions or intrahepatic biliary dilatation. The gallbladder is surgically absent. No common bile duct  dilatation. Pancreas: No mass, inflammation or ductal dilatation. Spleen: Normal size.  No focal lesions. Adrenals/Urinary Tract: The adrenal glands and kidneys are unremarkable. Small upper pole left renal cyst is noted. No renal calculi or hydroureteronephrosis. Bladder is unremarkable. Stomach/Bowel: The stomach, duodenum, small bowel and colon are unremarkable. No acute inflammatory changes, mass lesions or obstructive findings. The terminal ileum is normal. Findings consistent with acute appendicitis. The appendix is distended and demonstrates significant mucosal enhancement and significant periappendiceal inflammatory changes. No findings for rupture/perforation. There is a small appendicoliths noted near the tip. There is some surrounding fluid extending down to the pelvis but no definite abscess. Mild secondary inflammation of some of the surrounding small bowel loops and sigmoid colon. Adjacent reactive ileocolic lymph nodes are noted. Vascular/Lymphatic: The aorta is normal in caliber. No dissection. The branch vessels are patent. The major venous structures are patent. No mesenteric or retroperitoneal mass or adenopathy. Small scattered lymph nodes are noted. Reproductive: Multiple uterine fibroids. The ovaries are unremarkable. Other: Small amount of free pelvic fluid but no pelvic abscess. No abdominal wall hernia or subcutaneous lesions. Musculoskeletal: No significant bony findings. IMPRESSION: 1. CT findings consistent with acute appendicitis. No findings for rupture/perforation. 2. Status post cholecystectomy. No biliary dilatation. 3. Multiple uterine fibroids. Electronically Signed   By: Rudie Meyer M.D.   On: 06/28/2020 13:06    Review of Systems  Constitutional:  Negative for chills and fever.  HENT:  Negative for ear discharge, hearing loss and sore throat.   Eyes:  Negative for discharge.  Respiratory:  Negative for cough and shortness of breath.   Cardiovascular:  Negative for chest  pain and leg swelling.  Gastrointestinal:  Positive for abdominal pain, nausea and vomiting. Negative for constipation and diarrhea.  Musculoskeletal:  Negative for myalgias and neck pain.  Skin:  Negative for rash.  Allergic/Immunologic: Negative for environmental allergies.  Neurological:  Negative for dizziness and seizures.  Hematological:  Does not bruise/bleed easily.  Psychiatric/Behavioral:  Negative for suicidal ideas.   All other  systems reviewed and are negative.  Blood pressure 117/73, pulse 67, temperature 99 F (37.2 C), resp. rate 20, last menstrual period 01/17/2013, SpO2 99 %. Physical Exam Constitutional:      Appearance: She is well-developed.     Comments: Conversant No acute distress  Eyes:     General: Lids are normal. No scleral icterus.    Comments: Pupils are equal round and reactive No lid lag Moist conjunctiva  Neck:     Thyroid: No thyromegaly.     Trachea: No tracheal tenderness.     Comments: No cervical lymphadenopathy Cardiovascular:     Rate and Rhythm: Normal rate and regular rhythm.     Heart sounds: No murmur heard. Pulmonary:     Effort: Pulmonary effort is normal.     Breath sounds: Normal breath sounds. No wheezing or rales.  Abdominal:     Palpations: Abdomen is soft.     Tenderness: There is abdominal tenderness in the right lower quadrant. Positive signs include McBurney's sign.     Hernia: No hernia is present.  Skin:    General: Skin is warm.     Findings: No rash.     Nails: There is no clubbing.     Comments: Normal skin turgor  Neurological:     Mental Status: She is alert and oriented to person, place, and time.     Comments: Normal gait and station  Psychiatric:        Judgment: Judgment normal.     Comments: Appropriate affect     Assessment/Plan 60 year old female with acute appendicitis  1.  We will proceed to the operating for lap appendectomy. 2. I discussed with the patient the risks benefits of the procedure  to include but not limited to: Infection, bleeding, damage to surrounding structures, possible ileus, possible postoperative infection. Patient voiced understanding and wishes to proceed.   Axel Filler, MD 06/28/2020, 1:38 PM

## 2020-06-28 NOTE — ED Triage Notes (Signed)
Pt here POV with reports of RLQ abdominal pain with emesis onset yesterday. Pt also endorses chills. Denies fevers, diarrhea.

## 2020-06-29 ENCOUNTER — Encounter (HOSPITAL_COMMUNITY): Payer: Self-pay | Admitting: General Surgery

## 2020-06-29 LAB — CBC
HCT: 37.5 % (ref 36.0–46.0)
Hemoglobin: 12 g/dL (ref 12.0–15.0)
MCH: 30.5 pg (ref 26.0–34.0)
MCHC: 32 g/dL (ref 30.0–36.0)
MCV: 95.2 fL (ref 80.0–100.0)
Platelets: 216 10*3/uL (ref 150–400)
RBC: 3.94 MIL/uL (ref 3.87–5.11)
RDW: 13.4 % (ref 11.5–15.5)
WBC: 11.8 10*3/uL — ABNORMAL HIGH (ref 4.0–10.5)
nRBC: 0 % (ref 0.0–0.2)

## 2020-06-29 MED ORDER — OXYCODONE HCL 5 MG PO TABS: 5.0000 mg | ORAL_TABLET | Freq: Four times a day (QID) | ORAL | 0 refills | Status: AC | PRN

## 2020-06-29 NOTE — Discharge Instructions (Signed)
[facility]  Please arrive at least 30 min before your appointment to complete your check in paperwork.  If you are unable to arrive 30 min prior to your appointment time we may have to cancel or reschedule you. LAPAROSCOPIC SURGERY: POST OP INSTRUCTIONS Always review your discharge instruction sheet given to you by the facility where your surgery was performed. IF YOU HAVE DISABILITY OR FAMILY LEAVE FORMS, YOU MUST BRING THEM TO THE OFFICE FOR PROCESSING.   DO NOT GIVE THEM TO YOUR DOCTOR.  PAIN CONTROL  First take acetaminophen (Tylenol) AND/or ibuprofen (Advil) to control your pain after surgery.  Follow directions on package.  Taking acetaminophen (Tylenol) and/or ibuprofen (Advil) regularly after surgery will help to control your pain and lower the amount of prescription pain medication you may need.  You should not take more than 4,000 mg (4 grams) of acetaminophen (Tylenol) in 24 hours.  You should not take ibuprofen (Advil), aleve, motrin, naprosyn or other NSAIDS if you have a history of stomach ulcers or chronic kidney disease.  A prescription for pain medication may be given to you upon discharge.  Take your pain medication as prescribed, if you still have uncontrolled pain after taking acetaminophen (Tylenol) or ibuprofen (Advil). Use ice packs to help control pain. If you need a refill on your pain medication, please contact your pharmacy.  They will contact our office to request authorization. Prescriptions will not be filled after 5pm or on week-ends.  HOME MEDICATIONS Take your usually prescribed medications unless otherwise directed.  DIET You should follow a light diet the first few days after arrival home.  Be sure to include lots of fluids daily. Avoid fatty, fried foods.   CONSTIPATION It is common to experience some constipation after surgery and if you are taking pain medication.  Increasing fluid intake and taking a stool softener (such as Colace)  will usually help or prevent this problem from occurring.  A mild laxative (Milk of Magnesia or Miralax) should be taken according to package instructions if there are no bowel movements after 48 hours.  WOUND/INCISION CARE Most patients will experience some swelling and bruising in the area of the incisions.  Ice packs will help.  Swelling and bruising can take several days to resolve.  Unless discharge instructions indicate otherwise, follow guidelines below  STERI-STRIPS - you may remove your outer bandages 48 hours after surgery, and you may shower at that time.  You have steri-strips (small skin tapes) in place directly over the incision.  These strips should be left on the skin for 7-10 days.   DERMABOND/SKIN GLUE - you may shower in 24 hours.  The glue will flake off over the next 2-3 weeks. Any sutures or staples will be removed at the office during your follow-up visit.  ACTIVITIES You may resume regular (light) daily activities beginning the next day--such as daily self-care, walking, climbing stairs--gradually increasing activities as tolerated.  You may have sexual intercourse when it is comfortable.  Refrain from any heavy lifting or straining until approved by your doctor. You may drive when you are no longer taking prescription pain medication, you can comfortably wear a seatbelt, and you can safely maneuver your car and apply brakes.  FOLLOW-UP You should see your doctor in the office for a follow-up appointment approximately 2-3 weeks after your surgery.  You should have been given your post-op/follow-up appointment when your surgery was scheduled.  If you did not receive a post-op/follow-up appointment, make sure   that you call for this appointment within a day or two after you arrive home to insure a convenient appointment time.  OTHER INSTRUCTIONS  WHEN TO CALL YOUR DOCTOR: Fever over 101.0 Inability to urinate Continued bleeding from incision. Increased pain, redness, or  drainage from the incision. Increasing abdominal pain  The clinic staff is available to answer your questions during regular business hours.  Please don't hesitate to call and ask to speak to one of the nurses for clinical concerns.  If you have a medical emergency, go to the nearest emergency room or call 911.  A surgeon from [facility] is always on call at the hospital. [address], [address], [place], [place]  [address] ? [address] [address], [place], [place]   [address] [phone] ? [phone] ? [phone] [phone]   

## 2020-06-29 NOTE — Discharge Summary (Signed)
[facility] Discharge Summary   Patient ID: Kathryn Mcclain MRN: [id number] DOB/AGE: May 13, 1960 60 y.o.  Admit date: 06/28/2020 Discharge date: 06/29/2020  Admitting Diagnosis: Acute appendicitis  Discharge Diagnosis Acute appendicitis  History of laparoscopic appendectomy   Imaging: CT ABDOMEN PELVIS W CONTRAST  Result Date: 06/28/2020 CLINICAL DATA:  Right lower quadrant abdominal pain. EXAM: CT ABDOMEN AND PELVIS WITH CONTRAST TECHNIQUE: Multidetector CT imaging of the abdomen and pelvis was performed using the standard protocol following bolus administration of intravenous contrast. CONTRAST:  OMNIPAQUE IOHEXOL 300 MG/ML  SOLN COMPARISON:  None. FINDINGS: Lower chest: The lung bases are clear of acute process. No pleural effusion or pulmonary lesions. The heart is normal in size. No pericardial effusion. The distal esophagus and aorta are unremarkable. Hepatobiliary: No hepatic lesions or intrahepatic biliary dilatation. The gallbladder is surgically absent. No common bile duct dilatation. Pancreas: No mass, inflammation or ductal dilatation. Spleen: Normal size.  No focal lesions. Adrenals/Urinary Tract: The adrenal glands and kidneys are unremarkable. Small upper pole left renal cyst is noted. No renal calculi or hydroureteronephrosis. Bladder is unremarkable. Stomach/Bowel: The stomach, duodenum, small bowel and colon are unremarkable. No acute inflammatory changes, mass lesions or obstructive findings. The terminal ileum is normal. Findings consistent with acute appendicitis. The appendix is distended and demonstrates significant mucosal enhancement and significant periappendiceal inflammatory changes. No findings for rupture/perforation. There is a small appendicoliths noted near the tip. There is some surrounding fluid extending down to the pelvis but no definite abscess. Mild secondary inflammation of some of the surrounding small bowel loops and sigmoid colon.  Adjacent reactive ileocolic lymph nodes are noted. Vascular/Lymphatic: The aorta is normal in caliber. No dissection. The branch vessels are patent. The major venous structures are patent. No mesenteric or retroperitoneal mass or adenopathy. Small scattered lymph nodes are noted. Reproductive: Multiple uterine fibroids. The ovaries are unremarkable. Other: Small amount of free pelvic fluid but no pelvic abscess. No abdominal wall hernia or subcutaneous lesions. Musculoskeletal: No significant bony findings. IMPRESSION: 1. CT findings consistent with acute appendicitis. No findings for rupture/perforation. 2. Status post cholecystectomy. No biliary dilatation. 3. Multiple uterine fibroids. Electronically Signed   By: Rudie Meyer M.D.   On: 06/28/2020 13:06   DG Chest Port 1 View  Result Date: 06/28/2020 CLINICAL DATA:  Preoperative study.  No chest complaints. EXAM: PORTABLE CHEST 1 VIEW COMPARISON:  Chest x-ray dated March 12, 2016. FINDINGS: The heart size and mediastinal contours are within normal limits. Both lungs are clear. The visualized skeletal structures are unremarkable. IMPRESSION: No active disease. Electronically Signed   By: Obie Dredge M.D.   On: 06/28/2020 13:55    Procedures Dr. Derrell Lolling (06/28/20) - Laparoscopic Appendectomy  Hospital Course:  60 year old female who presented to [facility] ED with abdominal pain, nausea, emesis.  Workup showed CT with acute appendicitis, leukocytosis.  Patient was admitted and underwent procedure listed above.  Tolerated procedure well and was transferred to the floor.  Diet was advanced as tolerated.  On POD1, the patient was voiding well, tolerating diet, ambulating well, pain well controlled, vital signs stable, incisions c/d/i and felt stable for discharge home.  Patient will follow up in our office in 2-3 weeks and knows to call with questions or concerns. Her pathology was pending at time of discharge.  I or a member of my team have reviewed this  patient in the Controlled Substance Database.   Allergies as of 06/29/2020   No Known Allergies  Medication List     TAKE these medications    acetaminophen 500 MG tablet Commonly known as: TYLENOL Take 1,000 mg by mouth as needed for moderate pain or headache.   esomeprazole 40 MG capsule Commonly known as: NEXIUM Take 40 mg by mouth 2 (two) times daily.   folic acid 1 MG tablet Commonly known as: FOLVITE Take 1 mg by mouth daily.   oxyCODONE 5 MG immediate release tablet Commonly known as: Oxy IR/ROXICODONE Take 1 tablet (5 mg total) by mouth every 6 (six) hours as needed for up to 5 days for moderate pain or severe pain.   PROBIOTIC-10 PO Take 1 tablet by mouth daily.          Follow-up Information     [facility], [place]. Go on 07/23/2020.   Specialty: General Surgery Why: Your appointment is 7/7 at 1:15pm Please arrive 30 minutes prior to your appointment to check in and fill out paperwork. Bring photo ID and insurance information. Contact information: [address] [place] [address] [phone]                Signed: Clarise Cruz [facility] 06/29/2020, 11:44 AM Please see Amion for pager number during day hours 7:00am-4:30pm

## 2020-06-29 NOTE — Progress Notes (Signed)
Progress Note  1 Day Post-Op  Subjective: CC: abdominal pain and bloating this am. She has not needed opioid pain medications since yesterday. Passing flatus. No nausea or emesis. Ambulating. No respiratory complaints   Objective: Vital signs in last 24 hours: Temp:  [97.7 F (36.5 C)-99 F (37.2 C)] 98.2 F (36.8 C) (06/13 0444) Pulse Rate:  [50-82] 50 (06/13 0444) Resp:  [14-21] 16 (06/13 0444) BP: (102-151)/(73-95) 124/80 (06/13 0444) SpO2:  [92 %-100 %] 95 % (06/13 0444) Weight:  [92.5 kg-97.2 kg] 97.2 kg (06/12 1721) Last BM Date: 06/28/20  Intake/Output from previous day: 06/12 0701 - 06/13 0700 In: 1083.3 [P.O.:90; I.V.:826.1; IV Piggyback:167.2] Out: 5 [Blood:5] Intake/Output this shift: No intake/output data recorded.  PE: General: pleasant, WD, female who is sitting up in bed in NAD HEENT: head is normocephalic, atraumatic.  Sclera are noninjected. Mouth is pink and moist Heart: Palpable radial pulses - no cyanosis Lungs: Respiratory effort nonlabored Abd: soft, +BS, ND. Mild expected postoperative tenderness to palpation. Incisions with surgical glue intact. No erythema or dishcarge MS: all 4 extremities are symmetrical with no cyanosis, clubbing, or edema. Skin: warm and dry with no masses, lesions, or rashes Psych: A&Ox3 with an appropriate affect.    Lab Results:  Recent Labs    06/28/20 0850 06/29/20 0147  WBC 12.0* 11.8*  HGB 12.4 12.0  HCT 39.7 37.5  PLT 229 216   BMET Recent Labs    06/28/20 0850  NA 139  K 4.1  CL 108  CO2 26  GLUCOSE 104*  BUN 9  CREATININE 0.91  CALCIUM 9.1   PT/INR No results for input(s): LABPROT, INR in the last 72 hours. CMP     Component Value Date/Time   NA 139 06/28/2020 0850   K 4.1 06/28/2020 0850   CL 108 06/28/2020 0850   CO2 26 06/28/2020 0850   GLUCOSE 104 (H) 06/28/2020 0850   BUN 9 06/28/2020 0850   CREATININE 0.91 06/28/2020 0850   CALCIUM 9.1 06/28/2020 0850   PROT 7.4 06/28/2020 0850    ALBUMIN 3.2 (L) 06/28/2020 0850   AST 22 06/28/2020 0850   ALT 13 06/28/2020 0850   ALKPHOS 80 06/28/2020 0850   BILITOT 0.2 (L) 06/28/2020 0850   GFRNONAA >60 06/28/2020 0850   GFRAA >60 03/12/2016 0858   Lipase     Component Value Date/Time   LIPASE 26 06/28/2020 0850       Studies/Results: CT ABDOMEN PELVIS W CONTRAST  Result Date: 06/28/2020 CLINICAL DATA:  Right lower quadrant abdominal pain. EXAM: CT ABDOMEN AND PELVIS WITH CONTRAST TECHNIQUE: Multidetector CT imaging of the abdomen and pelvis was performed using the standard protocol following bolus administration of intravenous contrast. CONTRAST:  OMNIPAQUE IOHEXOL 300 MG/ML  SOLN COMPARISON:  None. FINDINGS: Lower chest: The lung bases are clear of acute process. No pleural effusion or pulmonary lesions. The heart is normal in size. No pericardial effusion. The distal esophagus and aorta are unremarkable. Hepatobiliary: No hepatic lesions or intrahepatic biliary dilatation. The gallbladder is surgically absent. No common bile duct dilatation. Pancreas: No mass, inflammation or ductal dilatation. Spleen: Normal size.  No focal lesions. Adrenals/Urinary Tract: The adrenal glands and kidneys are unremarkable. Small upper pole left renal cyst is noted. No renal calculi or hydroureteronephrosis. Bladder is unremarkable. Stomach/Bowel: The stomach, duodenum, small bowel and colon are unremarkable. No acute inflammatory changes, mass lesions or obstructive findings. The terminal ileum is normal. Findings consistent with acute appendicitis. The appendix is  distended and demonstrates significant mucosal enhancement and significant periappendiceal inflammatory changes. No findings for rupture/perforation. There is a small appendicoliths noted near the tip. There is some surrounding fluid extending down to the pelvis but no definite abscess. Mild secondary inflammation of some of the surrounding small bowel loops and sigmoid colon.  Adjacent reactive ileocolic lymph nodes are noted. Vascular/Lymphatic: The aorta is normal in caliber. No dissection. The branch vessels are patent. The major venous structures are patent. No mesenteric or retroperitoneal mass or adenopathy. Small scattered lymph nodes are noted. Reproductive: Multiple uterine fibroids. The ovaries are unremarkable. Other: Small amount of free pelvic fluid but no pelvic abscess. No abdominal wall hernia or subcutaneous lesions. Musculoskeletal: No significant bony findings. IMPRESSION: 1. CT findings consistent with acute appendicitis. No findings for rupture/perforation. 2. Status post cholecystectomy. No biliary dilatation. 3. Multiple uterine fibroids. Electronically Signed   By: Rudie Meyer M.D.   On: 06/28/2020 13:06   DG Chest Port 1 View  Result Date: 06/28/2020 CLINICAL DATA:  Preoperative study.  No chest complaints. EXAM: PORTABLE CHEST 1 VIEW COMPARISON:  Chest x-ray dated March 12, 2016. FINDINGS: The heart size and mediastinal contours are within normal limits. Both lungs are clear. The visualized skeletal structures are unremarkable. IMPRESSION: No active disease. Electronically Signed   By: Obie Dredge M.D.   On: 06/28/2020 13:55    Anti-infectives: Anti-infectives (From admission, onward)    Start     Dose/Rate Route Frequency Ordered Stop   06/28/20 1315  cefTRIAXone (ROCEPHIN) 2 g in sodium chloride 0.9 % 100 mL IVPB       See Hyperspace for full Linked Orders Report.   2 g 200 mL/hr over 30 Minutes Intravenous  Once 06/28/20 1309 06/28/20 1400   06/28/20 1315  metroNIDAZOLE (FLAGYL) IVPB 500 mg       See Hyperspace for full Linked Orders Report.   500 mg 100 mL/hr over 60 Minutes Intravenous  Once 06/28/20 1309 06/28/20 1424        Assessment/Plan Acute Appendicitis POD1 s/p laparoscopic appendectomy Dr. Derrell Lolling 6/12  - findings of inflamed scarred appendix - path pending - home today   LOS: 1 day    Eric Form,  [facility] 06/29/2020, 7:29 AM Please see Amion for pager number during day hours 7:00am-4:30pm

## 2020-06-29 NOTE — Plan of Care (Signed)

## 2020-06-29 NOTE — Progress Notes (Signed)
AVS given and reviewed with pt. Medications discussed. Work note from [place], [place] provided to pt. All questions answered to satisfaction. Pt verbalized understanding of information given. Pt escorted off the unit with all belongings via wheelchair by volunteer services.

## 2020-06-30 LAB — SURGICAL PATHOLOGY
